# Patient Record
Sex: Male | Born: 1965 | Race: White | Hispanic: No | Marital: Married | Smoking: Former smoker
Health system: Southern US, Community
[De-identification: ages and names within clinical notes are randomized; demographics above are authoritative.]

## PROBLEM LIST (undated history)

## (undated) DIAGNOSIS — R931 Abnormal findings on diagnostic imaging of heart and coronary circulation: Secondary | ICD-10-CM

## (undated) DIAGNOSIS — E291 Testicular hypofunction: Secondary | ICD-10-CM

## (undated) HISTORY — DX: Testicular hypofunction: E29.1

## (undated) HISTORY — PX: VASECTOMY: SHX75

## (undated) HISTORY — DX: Abnormal findings on diagnostic imaging of heart and coronary circulation: R93.1

---

## 1997-11-30 ENCOUNTER — Emergency Department (HOSPITAL_COMMUNITY): Admission: EM | Admit: 1997-11-30 | Discharge: 1997-11-30 | Payer: Self-pay | Admitting: Emergency Medicine

## 2001-02-04 ENCOUNTER — Encounter: Admission: RE | Admit: 2001-02-04 | Discharge: 2001-02-04 | Payer: Self-pay | Admitting: Urology

## 2001-02-04 ENCOUNTER — Encounter: Payer: Self-pay | Admitting: Urology

## 2001-02-11 ENCOUNTER — Other Ambulatory Visit: Admission: RE | Admit: 2001-02-11 | Discharge: 2001-02-11 | Payer: Self-pay | Admitting: Urology

## 2001-11-02 ENCOUNTER — Emergency Department (HOSPITAL_COMMUNITY): Admission: EM | Admit: 2001-11-02 | Discharge: 2001-11-02 | Payer: Self-pay | Admitting: Emergency Medicine

## 2001-11-02 ENCOUNTER — Encounter: Payer: Self-pay | Admitting: Emergency Medicine

## 2014-06-17 ENCOUNTER — Encounter: Payer: Self-pay | Admitting: *Deleted

## 2015-04-23 ENCOUNTER — Emergency Department
Admission: EM | Admit: 2015-04-23 | Discharge: 2015-04-23 | Disposition: A | Discharge status: Home / Self Care | Payer: No Typology Code available for payment source | Attending: Emergency Medicine | Admitting: Emergency Medicine

## 2015-04-23 DIAGNOSIS — Z87891 Personal history of nicotine dependence: Secondary | ICD-10-CM | POA: Diagnosis not present

## 2015-04-23 DIAGNOSIS — Y998 Other external cause status: Secondary | ICD-10-CM | POA: Insufficient documentation

## 2015-04-23 DIAGNOSIS — W1839XA Other fall on same level, initial encounter: Secondary | ICD-10-CM | POA: Insufficient documentation

## 2015-04-23 DIAGNOSIS — Y9289 Other specified places as the place of occurrence of the external cause: Secondary | ICD-10-CM | POA: Diagnosis not present

## 2015-04-23 DIAGNOSIS — S4992XA Unspecified injury of left shoulder and upper arm, initial encounter: Secondary | ICD-10-CM | POA: Diagnosis present

## 2015-04-23 DIAGNOSIS — S40012A Contusion of left shoulder, initial encounter: Secondary | ICD-10-CM | POA: Insufficient documentation

## 2015-04-23 DIAGNOSIS — Y9389 Activity, other specified: Secondary | ICD-10-CM | POA: Insufficient documentation

## 2015-04-23 DIAGNOSIS — S42022A Displaced fracture of shaft of left clavicle, initial encounter for closed fracture: Secondary | ICD-10-CM | POA: Insufficient documentation

## 2015-04-23 DIAGNOSIS — S42002A Fracture of unspecified part of left clavicle, initial encounter for closed fracture: Secondary | ICD-10-CM

## 2015-04-23 MED ORDER — OXYCODONE-ACETAMINOPHEN 5-325 MG PO TABS: 1.0000 | ORAL_TABLET | ORAL | Status: DC | PRN

## 2015-04-23 MED ORDER — OXYCODONE-ACETAMINOPHEN 5-325 MG PO TABS
1.0000 | ORAL_TABLET | Freq: Once | ORAL | Status: AC
  Administered 2015-04-23: 1 via ORAL
  Filled 2015-04-23: qty 1

## 2015-04-23 NOTE — ED Provider Notes (Signed)
[facility] Emergency Department Provider Note  ____________________________________________  Time seen: Approximately 3:15 PM  I have reviewed the triage vital signs and the nursing notes.   HISTORY  Chief Complaint Clavicle Injury   HPI Frederick Hayes is a 49 y.o. male is here with his wife after being seen at [facility] urgent care. Patient states that he fell onto his left shoulder yesterday and had pain. He continued to have pain today and was seen at urgent care where he was told that his x-ray showed a clavicle fracture. He was told to come to the emergency room immediately to see an orthopedist. Patient was not given a prescription for anything for pain. He was placed in a sling prior to being discharged at urgent care. Currently he rates his pain as 3 of 10. Pain is increased with range of motion.   History reviewed. No pertinent past medical history.  There are no active problems to display for this patient.   Past Surgical History  Procedure Laterality Date  . Vasectomy      Current Outpatient Rx  Name  Route  Sig  Dispense  Refill  . oxyCODONE-acetaminophen (PERCOCET) 5-325 MG tablet   Oral   Take 1-2 tablets by mouth every 4 (four) hours as needed for severe pain.   30 tablet   0     Allergies Review of patient's allergies indicates no known allergies.  Family History  Problem Relation Age of Onset  . Hyperlipidemia Father   . Hypertension Father   . Kidney disease Father     Social History Social History  Substance Use Topics  . Smoking status: Former Smoker    Quit date: 05/29/1990  . Smokeless tobacco: Never Used  . Alcohol Use: No    Review of Systems Constitutional: No fever/chills Eyes: No visual changes. ENT: No trauma Cardiovascular: Denies chest pain. Respiratory: Denies shortness of breath. Gastrointestinal:  No nausea, no vomiting.  No diarrhea.   Musculoskeletal: Positive left shoulder pain. Skin:  Negative for rash. Neurological: Negative for headaches, focal weakness or numbness.  10-point ROS otherwise negative.  ____________________________________________   PHYSICAL EXAM:  VITAL SIGNS: ED Triage Vitals  Enc Vitals Group     BP 04/23/15 1430 128/65 mmHg     Pulse Rate 04/23/15 1430 64     Resp 04/23/15 1430 17     Temp 04/23/15 1430 98.6 F (37 C)     Temp Source 04/23/15 1430 Oral     SpO2 04/23/15 1430 99 %     Weight 04/23/15 1430 176 lb (79.833 kg)     Height 04/23/15 1430 5\' 9"  (1.753 m)     Head Cir --      Peak Flow --      Pain Score 04/23/15 1431 3     Pain Loc --      Pain Edu? --      Excl. in [place]? --     Constitutional: Alert and oriented. Well appearing and in no acute distress. Eyes: Conjunctivae are normal. PERRL. EOMI. Head: Atraumatic. Nose: No congestion/rhinnorhea. Neck: No stridor.   Cardiovascular: Normal rate, regular rhythm. Grossly normal heart sounds.  Good peripheral circulation. Respiratory: Normal respiratory effort.  No retractions. Lungs CTAB. Gastrointestinal:  No distention. Musculoskeletal: Examination of the left clavicle there is some soft tissue swelling present with minimal ecchymosis. There is moderate tenderness on palpation. There is no tenting of the skin noted. Neurologic:  Normal speech and language. No gross focal  neurologic deficits are appreciated. No gait instability. Skin:  Skin is warm, dry and intact. No rash noted. Psychiatric: Mood and affect are normal. Speech and behavior are normal.  ____________________________________________   LABS (all labs ordered are listed, but only abnormal results are displayed)  Labs Reviewed - No data to display    PROCEDURES  Procedure(s) performed: None  Critical Care performed: No  ____________________________________________   INITIAL IMPRESSION / ASSESSMENT AND PLAN / ED COURSE  Pertinent labs & imaging results that were available during my care of the  patient were reviewed by me and considered in my medical decision making (see chart for details).  Patient is given Percocet while in the emergency room for pain. We discussed positioning his sling for more comfort. Also ice and elevation as needed for swelling and for pain. He is given a prescription for Percocet as needed for pain and also instructions to call Dr. Nicholaus Bloom office on Monday for an appointment. ____________________________________________   FINAL CLINICAL IMPRESSION(S) / ED DIAGNOSES  Final diagnoses:  Fracture of left clavicle, closed, initial encounter      Johnn Hai, PA-C 04/23/15 1549  Harvest Dark, MD 04/23/15 1825

## 2015-04-23 NOTE — ED Notes (Signed)
Pt states he was sent from urgent care for left clavicle Fx, states he needed to see ortho today.the patient states he fell and injured it yesterday

## 2015-04-23 NOTE — ED Notes (Signed)
Says left clavicle fx and was sent here from [facility] for ortho.

## 2015-04-23 NOTE — ED Notes (Signed)
Adjusted sling already in place from [facility].

## 2015-04-23 NOTE — Discharge Instructions (Signed)
Clavicle Fracture A clavicle fracture is a broken collarbone. The collarbone is the long bone that connects your shoulder to your rib cage. A broken collarbone may be treated with a sling, a wrap, or surgery. Treatment depends on whether the broken ends of the bone are out of place or not. HOME CARE  Put ice on the injured area:  Put ice in a plastic bag.  Place a towel between your skin and the bag.  Leave the ice on for 20 minutes, 2-3 times a day.  If you have a wrap or splint:  Wear it all the time, and remove it only to take a bath or shower.  When you bathe or shower, keep your shoulder in the same place as when the sling or wrap is on.  Do not lift your arm.  If you have a wrap:  Another person must tighten it every day.  It should be tight enough to hold your shoulders back.  Make sure you have enough room to put your pointer finger between your body and the strap.  Loosen the wrap right away if you cannot feel your arm or your hands tingle.  Only take medicines as told by your doctor.  Avoid activities that make the injury or pain worse for 4-6 weeks after surgery.  Keep all follow-up appointments. GET HELP IF:  Your medicine is not making you feel less pain.  Your medicine is not making swelling better. GET HELP RIGHT AWAY IF:   Your cannot feel your arm.  Your arm is cold.  Your arm is a lighter color than normal. MAKE SURE YOU:   Understand these instructions.  Will watch your condition.  Will get help right away if you are not doing well or get worse.   This information is not intended to replace advice given to you by your health care provider. Make sure you discuss any questions you have with your health care provider.   Document Released: 11/01/2007 Document Revised: 05/20/2013 Document Reviewed: 04/07/2013 Elsevier Interactive Patient Education 2016 [place] and elevate as needed for pain and swelling. Take Percocet as  directed for pain. Call Dr. Nicholaus Bloom office on Monday for an appointment. Let the receptionist know that you were seen in the emergency room. Return to the emergency room if any severe worsening of your symptoms.

## 2015-04-29 ENCOUNTER — Encounter: Payer: Self-pay | Admitting: Family Medicine

## 2015-04-29 ENCOUNTER — Ambulatory Visit (INDEPENDENT_AMBULATORY_CARE_PROVIDER_SITE_OTHER): Payer: PRIVATE HEALTH INSURANCE | Admitting: Family Medicine

## 2015-04-29 VITALS — BP 136/78 | HR 61 | Temp 98.6°F | Resp 18 | Ht 70.0 in | Wt 178.0 lb

## 2015-04-29 DIAGNOSIS — Z7189 Other specified counseling: Secondary | ICD-10-CM | POA: Diagnosis not present

## 2015-04-29 DIAGNOSIS — Z7689 Persons encountering health services in other specified circumstances: Secondary | ICD-10-CM

## 2015-04-29 DIAGNOSIS — Z0000 Encounter for general adult medical examination without abnormal findings: Secondary | ICD-10-CM | POA: Diagnosis not present

## 2015-04-29 NOTE — Progress Notes (Signed)
Subjective:    Patient ID: Frederick Hayes, male    DOB: 08-07-1965, 49 y.o.   MRN: [id number]  HPI He is a very pleasant 49 year old Caucasian male here today to establish care.  His only concern is his borderline blood pressure. He is a very active gentleman. He exercises several times a week and plays competitive tennis. He has lost almost 20 pounds in the last few months. His goal weight based on his height would be 170 pounds. He does have a family history of cardiovascular disease however his father was obese and a diabetic. He is asymptomatic. He denies any chest pain shortness of breath or dyspnea on exertion. Otherwise he has no concerns No past medical history on file. Past Surgical History  Procedure Laterality Date  . Vasectomy     Current Outpatient Prescriptions on File Prior to Visit  Medication Sig Dispense Refill  . oxyCODONE-acetaminophen (PERCOCET) 5-325 MG tablet Take 1-2 tablets by mouth every 4 (four) hours as needed for severe pain. 30 tablet 0   No current facility-administered medications on file prior to visit.   No Known Allergies Social History   Social History  . Marital Status: Married    Spouse Name: N/A  . Number of Children: N/A  . Years of Education: N/A   Occupational History  . Not on file.   Social History Main Topics  . Smoking status: Former Smoker    Quit date: 05/29/1990  . Smokeless tobacco: Never Used  . Alcohol Use: No  . Drug Use: No  . Sexual Activity: Yes    Birth Control/ Protection: Surgical     Comment: Married   Other Topics Concern  . Not on file   Social History Narrative   Family History  Problem Relation Age of Onset  . Hyperlipidemia Father   . Hypertension Father   . Kidney disease Father   . Stroke Father   . Heart disease Father   . Hypertension Brother   . Heart disease Paternal Grandmother   . Heart disease Paternal Grandfather       Review of Systems  All other systems reviewed and are  negative.      Objective:   Physical Exam  Constitutional: He is oriented to person, place, and time. He appears well-developed and well-nourished. No distress.  HENT:  Head: Normocephalic and atraumatic.  Right Ear: External ear normal.  Left Ear: External ear normal.  Nose: Nose normal.  Mouth/Throat: Oropharynx is clear and moist. No oropharyngeal exudate.  Eyes: Conjunctivae and EOM are normal. Pupils are equal, round, and reactive to light. Right eye exhibits no discharge. Left eye exhibits no discharge. No scleral icterus.  Neck: Normal range of motion. Neck supple. No JVD present. No tracheal deviation present. No thyromegaly present.  Cardiovascular: Normal rate, regular rhythm, normal heart sounds and intact distal pulses.  Exam reveals no gallop and no friction rub.   No murmur heard. Pulmonary/Chest: Effort normal and breath sounds normal. No stridor. No respiratory distress. He has no wheezes. He has no rales. He exhibits no tenderness.  Abdominal: Soft. Bowel sounds are normal. He exhibits no distension and no mass. There is no tenderness. There is no rebound and no guarding.  Genitourinary: Rectum normal and prostate normal.  Musculoskeletal: Normal range of motion. He exhibits no edema or tenderness.  Lymphadenopathy:    He has no cervical adenopathy.  Neurological: He is alert and oriented to person, place, and time. He has normal reflexes. He  displays normal reflexes. No cranial nerve deficit. He exhibits normal muscle tone. Coordination normal.  Skin: Skin is warm. No rash noted. He is not diaphoretic. No erythema. No pallor.  Psychiatric: He has a normal mood and affect. His behavior is normal. Judgment and thought content normal.  Vitals reviewed.         Assessment & Plan:  Routine general medical examination at a health care facility - Plan: CBC with Differential/Platelet, COMPLETE METABOLIC PANEL WITH GFR, Lipid panel, PSA  Establishing care with new doctor,  encounter for  Physical exam today is normal. However there is a suspicious lesion adjacent to his right eye neck to the nasal bridge. It is a 7 mm flattopped fleshy papule. It appears to have raised rolled edges and some pearly iridescent. There may also be some mild central depression. It is hard to tell if it's a dermal mole versus a possible basal cell. I have recommended having him have his dermatologist take a look at that. I would like him to return fasting for a CBC, CMP, fasting lipid panel, and a PSA. I offered the patient a flu shot but he declined. He is due for colonoscopy after he turns 32.  He will call me when he wants me to schedule that. I recommended a DASH diet, <2000 mg sodium per day.

## 2015-05-03 ENCOUNTER — Other Ambulatory Visit: Payer: PRIVATE HEALTH INSURANCE

## 2015-05-03 LAB — COMPLETE METABOLIC PANEL WITH GFR
ALBUMIN: 4.5 g/dL (ref 3.6–5.1)
ALK PHOS: 49 U/L (ref 40–115)
ALT: 18 U/L (ref 9–46)
AST: 15 U/L (ref 10–40)
BUN: 17 mg/dL (ref 7–25)
CO2: 32 mmol/L — AB (ref 20–31)
Calcium: 9.8 mg/dL (ref 8.6–10.3)
Chloride: 104 mmol/L (ref 98–110)
Creat: 1.06 mg/dL (ref 0.60–1.35)
GFR, EST NON AFRICAN AMERICAN: 82 mL/min (ref >=60)
GLUCOSE: 80 mg/dL (ref 70–99)
Potassium: 4.4 mmol/L (ref 3.5–5.3)
Sodium: 143 mmol/L (ref 135–146)
TOTAL PROTEIN: 6.9 g/dL (ref 6.1–8.1)
Total Bilirubin: 0.5 mg/dL (ref 0.2–1.2)

## 2015-05-03 LAB — CBC WITH DIFFERENTIAL/PLATELET
Basophils Absolute: 0.1 10*3/uL (ref 0.0–0.1)
Basophils Relative: 1 % (ref 0–1)
Eosinophils Absolute: 0.1 10*3/uL (ref 0.0–0.7)
Eosinophils Relative: 1 % (ref 0–5)
HCT: 49.4 % (ref 39.0–52.0)
Hemoglobin: 17 g/dL (ref 13.0–17.0)
Lymphocytes Relative: 18 % (ref 12–46)
Lymphs Abs: 1.6 10*3/uL (ref 0.7–4.0)
MCH: 32.9 pg (ref 26.0–34.0)
MCHC: 34.4 g/dL (ref 30.0–36.0)
MCV: 95.7 fL (ref 78.0–100.0)
MPV: 10.5 fL (ref 8.6–12.4)
Monocytes Absolute: 0.9 10*3/uL (ref 0.1–1.0)
Monocytes Relative: 10 % (ref 3–12)
Neutro Abs: 6.2 10*3/uL (ref 1.7–7.7)
Neutrophils Relative %: 70 % (ref 43–77)
Platelets: 299 10*3/uL (ref 150–400)
RBC: 5.16 MIL/uL (ref 4.22–5.81)
RDW: 13.9 % (ref 11.5–15.5)
WBC: 8.9 10*3/uL (ref 4.0–10.5)

## 2015-05-03 LAB — LIPID PANEL
CHOL/HDL RATIO: 4.8 ratio (ref <=5.0)
CHOLESTEROL: 167 mg/dL (ref 125–200)
HDL: 35 mg/dL — AB (ref >=40)
LDL Cholesterol: 101 mg/dL (ref <130)
TRIGLYCERIDES: 157 mg/dL — AB (ref <150)
VLDL: 31 mg/dL — ABNORMAL HIGH (ref <30)

## 2015-05-04 ENCOUNTER — Encounter: Payer: Self-pay | Admitting: Family Medicine

## 2015-05-04 LAB — PSA: PSA: 1.45 ng/mL (ref <=4.00)

## 2015-07-22 ENCOUNTER — Other Ambulatory Visit: Payer: Self-pay | Admitting: Family Medicine

## 2015-07-22 MED ORDER — OSELTAMIVIR PHOSPHATE 75 MG PO CAPS: 75.0000 mg | ORAL_CAPSULE | Freq: Every day | ORAL | Status: DC

## 2015-07-22 NOTE — Progress Notes (Signed)
Wife in office with + Flu.  RX for Tamiflu to pharmacy for husband per provider instructions

## 2015-11-29 ENCOUNTER — Emergency Department (HOSPITAL_COMMUNITY): Payer: No Typology Code available for payment source

## 2015-11-29 ENCOUNTER — Emergency Department (HOSPITAL_COMMUNITY)
Admission: EM | Admit: 2015-11-29 | Discharge: 2015-11-29 | Disposition: A | Discharge status: Home / Self Care | Payer: No Typology Code available for payment source | Attending: Emergency Medicine | Admitting: Emergency Medicine

## 2015-11-29 ENCOUNTER — Encounter (HOSPITAL_COMMUNITY): Payer: Self-pay

## 2015-11-29 DIAGNOSIS — Y92410 Unspecified street and highway as the place of occurrence of the external cause: Secondary | ICD-10-CM | POA: Insufficient documentation

## 2015-11-29 DIAGNOSIS — T07XXXA Unspecified multiple injuries, initial encounter: Secondary | ICD-10-CM

## 2015-11-29 DIAGNOSIS — Y939 Activity, unspecified: Secondary | ICD-10-CM | POA: Insufficient documentation

## 2015-11-29 DIAGNOSIS — S50812A Abrasion of left forearm, initial encounter: Secondary | ICD-10-CM | POA: Diagnosis not present

## 2015-11-29 DIAGNOSIS — Z87891 Personal history of nicotine dependence: Secondary | ICD-10-CM | POA: Diagnosis not present

## 2015-11-29 DIAGNOSIS — S60511A Abrasion of right hand, initial encounter: Secondary | ICD-10-CM | POA: Insufficient documentation

## 2015-11-29 DIAGNOSIS — Y999 Unspecified external cause status: Secondary | ICD-10-CM | POA: Insufficient documentation

## 2015-11-29 DIAGNOSIS — S6991XA Unspecified injury of right wrist, hand and finger(s), initial encounter: Secondary | ICD-10-CM | POA: Diagnosis present

## 2015-11-29 MED ORDER — NAPROXEN 250 MG PO TABS
375.0000 mg | ORAL_TABLET | Freq: Once | ORAL | Status: AC
  Administered 2015-11-29: 375 mg via ORAL
  Filled 2015-11-29: qty 2

## 2015-11-29 MED ORDER — TETANUS-DIPHTH-ACELL PERTUSSIS 5-2.5-18.5 LF-MCG/0.5 IM SUSP
0.5000 mL | Freq: Once | INTRAMUSCULAR | Status: AC
  Administered 2015-11-29: 0.5 mL via INTRAMUSCULAR
  Filled 2015-11-29: qty 0.5

## 2015-11-29 MED ORDER — BACITRACIN ZINC 500 UNIT/GM EX OINT
TOPICAL_OINTMENT | Freq: Two times a day (BID) | CUTANEOUS | Status: DC
Start: 2015-11-29 — End: 2015-12-24

## 2015-11-29 MED ORDER — NAPROXEN 375 MG PO TABS: 375.0000 mg | ORAL_TABLET | Freq: Three times a day (TID) | ORAL | Status: DC

## 2015-11-29 MED ORDER — TETANUS-DIPHTHERIA TOXOIDS TD 5-2 LFU IM INJ
0.5000 mL | INJECTION | Freq: Once | INTRAMUSCULAR | Status: DC
  Filled 2015-11-29: qty 0.5

## 2015-11-29 MED ORDER — DIAZEPAM 5 MG PO TABS: 5.0000 mg | ORAL_TABLET | Freq: Three times a day (TID) | ORAL | Status: DC | PRN

## 2015-11-29 MED ORDER — BACITRACIN ZINC 500 UNIT/GM EX OINT
TOPICAL_OINTMENT | Freq: Once | CUTANEOUS | Status: AC
  Administered 2015-11-29: 3 via TOPICAL

## 2015-11-29 MED ORDER — DIAZEPAM 5 MG PO TABS
5.0000 mg | ORAL_TABLET | Freq: Once | ORAL | Status: AC
  Administered 2015-11-29: 5 mg via ORAL
  Filled 2015-11-29: qty 1
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
## 2015-11-29 NOTE — Discharge Instructions (Signed)

## 2015-12-24 ENCOUNTER — Ambulatory Visit (INDEPENDENT_AMBULATORY_CARE_PROVIDER_SITE_OTHER): Payer: PRIVATE HEALTH INSURANCE | Admitting: Family Medicine

## 2015-12-24 ENCOUNTER — Encounter: Payer: Self-pay | Admitting: Family Medicine

## 2015-12-24 VITALS — BP 108/70 | HR 64 | Temp 98.5°F | Resp 14 | Wt 191.0 lb

## 2015-12-24 DIAGNOSIS — S46812A Strain of other muscles, fascia and tendons at shoulder and upper arm level, left arm, initial encounter: Secondary | ICD-10-CM | POA: Diagnosis not present

## 2015-12-24 DIAGNOSIS — M25512 Pain in left shoulder: Secondary | ICD-10-CM
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
## 2016-01-01 ENCOUNTER — Ambulatory Visit
Admission: RE | Admit: 2016-01-01 | Discharge: 2016-01-01 | Disposition: A | Discharge status: Home / Self Care | Payer: PRIVATE HEALTH INSURANCE | Source: Ambulatory Visit | Attending: Family Medicine | Admitting: Family Medicine

## 2016-01-01 DIAGNOSIS — S46812A Strain of other muscles, fascia and tendons at shoulder and upper arm level, left arm, initial encounter: Secondary | ICD-10-CM

## 2016-01-01 DIAGNOSIS — M25512 Pain in left shoulder: Secondary | ICD-10-CM

## 2016-01-04 ENCOUNTER — Ambulatory Visit (INDEPENDENT_AMBULATORY_CARE_PROVIDER_SITE_OTHER): Payer: PRIVATE HEALTH INSURANCE | Admitting: Family Medicine

## 2016-01-04 ENCOUNTER — Encounter: Payer: Self-pay | Admitting: Family Medicine

## 2016-01-04 VITALS — BP 114/80 | HR 66 | Resp 20 | Wt 196.0 lb

## 2016-01-04 DIAGNOSIS — M25512 Pain in left shoulder: Secondary | ICD-10-CM

## 2016-01-04 DIAGNOSIS — M7582 Other shoulder lesions, left shoulder: Secondary | ICD-10-CM | POA: Diagnosis not present
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
## 2019-03-06 ENCOUNTER — Encounter: Payer: Self-pay | Admitting: Family Medicine

## 2019-03-06 ENCOUNTER — Other Ambulatory Visit: Payer: Self-pay

## 2019-03-06 ENCOUNTER — Ambulatory Visit (INDEPENDENT_AMBULATORY_CARE_PROVIDER_SITE_OTHER): Payer: PRIVATE HEALTH INSURANCE | Admitting: Family Medicine

## 2019-03-06 VITALS — BP 128/64 | HR 74 | Temp 97.4°F | Resp 14 | Ht 70.0 in | Wt 207.0 lb

## 2019-03-06 DIAGNOSIS — M722 Plantar fascial fibromatosis: Secondary | ICD-10-CM

## 2019-03-06 MED ORDER — CELECOXIB 200 MG PO CAPS: 200.0000 mg | ORAL_CAPSULE | Freq: Two times a day (BID) | ORAL | 1 refills | Status: DC

## 2019-03-06 NOTE — Progress Notes (Signed)
Subjective:    Patient ID: Frederick Hayes, male    DOB: 06-06-1965, 53 y.o.   MRN: [id number]  HPI Patient believes he has been suffering with plantar fasciitis for many months now.  He is tried massaging the ball of his foot with an ice he can on occasion.  He is taken Celebrex from a friend on occasion and saw benefit with that.  However he has done nothing consistently.  He reports pain over the plantar aspect of his left calcaneus.  The pain is worse in the middle of the calcaneus and slightly lateral.  It is worse when he first gets out of bed in the morning or after sitting for a prolonged period of time.  He reports a stabbing pain in his heel.  It hurts to walk.  He gets about 10,000 steps a day and he feels pain with the majority of the steps.  He denies any falls or injuries.  There is no erythema or swelling or deformity seen in his left heel however the pain is keeping him from being able to play tennis.  He has not tried any specific stretches.  He has not tried a night splint.  He is not taking any regular NSAID. No past medical history on file. Past Surgical History:  Procedure Laterality Date  . VASECTOMY     No current outpatient medications on file prior to visit.   No current facility-administered medications on file prior to visit.    No Known Allergies Social History   Socioeconomic History  . Marital status: Married    Spouse name: Not on file  . Number of children: Not on file  . Years of education: Not on file  . Highest education level: Not on file  Occupational History  . Not on file  Social Needs  . Financial resource strain: Not on file  . Food insecurity    Worry: Not on file    Inability: Not on file  . Transportation needs    Medical: Not on file    Non-medical: Not on file  Tobacco Use  . Smoking status: Former Smoker    Quit date: 05/29/1990    Years since quitting: 28.7  . Smokeless tobacco: Never Used  Substance and Sexual Activity  .  Alcohol use: No  . Drug use: No  . Sexual activity: Yes    Birth control/protection: Surgical    Comment: Married  Lifestyle  . Physical activity    Days per week: Not on file    Minutes per session: Not on file  . Stress: Not on file  Relationships  . Social Herbalist on phone: Not on file    Gets together: Not on file    Attends religious service: Not on file    Active member of club or organization: Not on file    Attends meetings of clubs or organizations: Not on file    Relationship status: Not on file  . Intimate partner violence    Fear of current or ex partner: Not on file    Emotionally abused: Not on file    Physically abused: Not on file    Forced sexual activity: Not on file  Other Topics Concern  . Not on file  Social History Narrative  . Not on file      Review of Systems  All other systems reviewed and are negative.      Objective:   Physical Exam  Cardiovascular:  Normal rate, regular rhythm and normal heart sounds.  Pulmonary/Chest: Effort normal and breath sounds normal.  Musculoskeletal:     Left foot: Normal range of motion. Tenderness present. No bony tenderness, swelling or crepitus.       Feet:  Vitals reviewed.         Assessment & Plan:  Plantar fasciitis of left foot  Recommended wearing a night splint for plantar fasciitis every night for the next 2 weeks.  Recommended performing stretches to improve the flexibility of the plantar fascia such as heel lunges on a step, toe stretches using a towel, etc.  Recommended taking Celebrex 200 mg p.o. twice daily for the next 2 weeks.  If the pain is improving, gradually increase activity as tolerated.  If no improvement, return for cortisone injection in the left heel

## 2019-03-21 ENCOUNTER — Ambulatory Visit (INDEPENDENT_AMBULATORY_CARE_PROVIDER_SITE_OTHER): Payer: PRIVATE HEALTH INSURANCE | Admitting: Family Medicine

## 2019-03-21 ENCOUNTER — Other Ambulatory Visit: Payer: Self-pay | Admitting: Family Medicine

## 2019-03-21 ENCOUNTER — Other Ambulatory Visit: Payer: Self-pay

## 2019-03-21 VITALS — BP 128/84 | HR 62 | Temp 98.4°F | Ht 70.0 in | Wt 215.0 lb

## 2019-03-21 DIAGNOSIS — M722 Plantar fascial fibromatosis: Secondary | ICD-10-CM

## 2019-03-21 NOTE — Progress Notes (Signed)
Subjective:    Patient ID: Frederick Hayes, male    DOB: [birth year], 53 y.o.   MRN: [id number]  HPI  03/06/19 Patient believes he has been suffering with plantar fasciitis for many months now.  He is tried massaging the ball of his foot with an ice he can on occasion.  He is taken Celebrex from a friend on occasion and saw benefit with that.  However he has done nothing consistently.  He reports pain over the plantar aspect of his left calcaneus.  The pain is worse in the middle of the calcaneus and slightly lateral.  It is worse when he first gets out of bed in the morning or after sitting for a prolonged period of time.  He reports a stabbing pain in his heel.  It hurts to walk.  He gets about 10,000 steps a day and he feels pain with the majority of the steps.  He denies any falls or injuries.  There is no erythema or swelling or deformity seen in his left heel however the pain is keeping him from being able to play tennis.  He has not tried any specific stretches.  He has not tried a night splint.  He is not taking any regular NSAID.  At that time, my plan was: Recommended wearing a night splint for plantar fasciitis every night for the next 2 weeks.  Recommended performing stretches to improve the flexibility of the plantar fascia such as heel lunges on a step, toe stretches using a towel, etc.  Recommended taking Celebrex 200 mg p.o. twice daily for the next 2 weeks.  If the pain is improving, gradually increase activity as tolerated.  If no improvement, return for cortisone injection in the left heel  03/21/19 Patient states that he saw some benefit from Celebrex however he continues to have pain in the plantar aspect of his left heel near the insertion of the plantar fascia on the calcaneus.  He still has some swelling and tenderness in that area to palpation.  He also reports pain after playing tennis.  He has been wearing the night splint.  He states that he can tolerate it for a few hours  however after that he starts to experience pain in the posterior aspect of his calf due to the stretching.  Patient would like to take the next step and try cortisone injection in the heel.  There is no erythema.  There is no bruising in the heel.  There is no palpable abnormality. No past medical history on file. Past Surgical History:  Procedure Laterality Date  . VASECTOMY     Current Outpatient Medications on File Prior to Visit  Medication Sig Dispense Refill  . celecoxib (CELEBREX) 200 MG capsule Take 1 capsule (200 mg total) by mouth 2 (two) times daily. 60 capsule 1   No current facility-administered medications on file prior to visit.    No Known Allergies Social History   Socioeconomic History  . Marital status: Married    Spouse name: Not on file  . Number of children: Not on file  . Years of education: Not on file  . Highest education level: Not on file  Occupational History  . Not on file  Social Needs  . Financial resource strain: Not on file  . Food insecurity    Worry: Not on file    Inability: Not on file  . Transportation needs    Medical: Not on file    Non-medical: Not on  file  Tobacco Use  . Smoking status: Former Smoker    Quit date: 05/29/1990    Years since quitting: 28.8  . Smokeless tobacco: Never Used  Substance and Sexual Activity  . Alcohol use: No  . Drug use: No  . Sexual activity: Yes    Birth control/protection: Surgical    Comment: Married  Lifestyle  . Physical activity    Days per week: Not on file    Minutes per session: Not on file  . Stress: Not on file  Relationships  . Social Herbalist on phone: Not on file    Gets together: Not on file    Attends religious service: Not on file    Active member of club or organization: Not on file    Attends meetings of clubs or organizations: Not on file    Relationship status: Not on file  . Intimate partner violence    Fear of current or ex partner: Not on file     Emotionally abused: Not on file    Physically abused: Not on file    Forced sexual activity: Not on file  Other Topics Concern  . Not on file  Social History Narrative  . Not on file      Review of Systems  All other systems reviewed and are negative.      Objective:   Physical Exam  Cardiovascular: Normal rate, regular rhythm and normal heart sounds.  Pulmonary/Chest: Effort normal and breath sounds normal.  Musculoskeletal:     Left foot: Normal range of motion. Tenderness present. No bony tenderness, swelling or crepitus.       Feet:  Vitals reviewed.         Assessment & Plan:  Plantar fasciitis of left foot  Using sterile technique, the patient received a cortisone injection on the plantar aspect of the left calcaneus near the insertion of the plantar fascia.  The area was prepped with Betadine and then a mixture of 0.1% lidocaine, 1 cc, and 1 cc of 40 mg/mL Kenalog was injected at the point of maximum tenderness.  The patient tolerated the procedure well without complication.  Hemostasis was achieved with a Band-Aid.  Recommended gradual increase in activity over the next 2 weeks and if still pain-free at that time, start advancing activity more liberally.  If pain does not improve, I would recommend a podiatry consultation

## 2019-04-29 ENCOUNTER — Other Ambulatory Visit: Payer: Self-pay

## 2019-04-29 MED ORDER — CELECOXIB 200 MG PO CAPS: 200.0000 mg | ORAL_CAPSULE | Freq: Two times a day (BID) | ORAL | 1 refills | Status: DC

## 2019-10-30 ENCOUNTER — Other Ambulatory Visit: Payer: Self-pay

## 2019-10-30 ENCOUNTER — Ambulatory Visit (INDEPENDENT_AMBULATORY_CARE_PROVIDER_SITE_OTHER): Payer: PRIVATE HEALTH INSURANCE | Admitting: Family Medicine

## 2019-10-30 VITALS — BP 120/82 | HR 55 | Temp 97.1°F | Wt 196.0 lb

## 2019-10-30 DIAGNOSIS — R5382 Chronic fatigue, unspecified: Secondary | ICD-10-CM | POA: Diagnosis not present

## 2019-10-30 NOTE — Progress Notes (Signed)
Subjective:    Patient ID: Frederick Hayes, male    DOB: 12-Apr-1966, 54 y.o.   MRN: [id number]  HPI  03/06/19 Patient believes he has been suffering with plantar fasciitis for many months now.  He is tried massaging the ball of his foot with an ice he can on occasion.  He is taken Celebrex from a friend on occasion and saw benefit with that.  However he has done nothing consistently.  He reports pain over the plantar aspect of his left calcaneus.  The pain is worse in the middle of the calcaneus and slightly lateral.  It is worse when he first gets out of bed in the morning or after sitting for a prolonged period of time.  He reports a stabbing pain in his heel.  It hurts to walk.  He gets about 10,000 steps a day and he feels pain with the majority of the steps.  He denies any falls or injuries.  There is no erythema or swelling or deformity seen in his left heel however the pain is keeping him from being able to play tennis.  He has not tried any specific stretches.  He has not tried a night splint.  He is not taking any regular NSAID.  At that time, my plan was: Recommended wearing a night splint for plantar fasciitis every night for the next 2 weeks.  Recommended performing stretches to improve the flexibility of the plantar fascia such as heel lunges on a step, toe stretches using a towel, etc.  Recommended taking Celebrex 200 mg p.o. twice daily for the next 2 weeks.  If the pain is improving, gradually increase activity as tolerated.  If no improvement, return for cortisone injection in the left heel  03/21/19 Patient states that he saw some benefit from Celebrex however he continues to have pain in the plantar aspect of his left heel near the insertion of the plantar fascia on the calcaneus.  He still has some swelling and tenderness in that area to palpation.  He also reports pain after playing tennis.  He has been wearing the night splint.  He states that he can tolerate it for a few hours  however after that he starts to experience pain in the posterior aspect of his calf due to the stretching.  Patient would like to take the next step and try cortisone injection in the heel.  There is no erythema.  There is no bruising in the heel.  There is no palpable abnormality.  At that time, my plan was: Using sterile technique, the patient received a cortisone injection on the plantar aspect of the left calcaneus near the insertion of the plantar fascia.  The area was prepped with Betadine and then a mixture of 0.1% lidocaine, 1 cc, and 1 cc of 40 mg/mL Kenalog was injected at the point of maximum tenderness.  The patient tolerated the procedure well without complication.  Hemostasis was achieved with a Band-Aid.  Recommended gradual increase in activity over the next 2 weeks and if still pain-free at that time, start advancing activity more liberally.  If pain does not improve, I would recommend a podiatry consultation  10/30/19 Patient presents today requesting to have his testosterone level checked.  He states that he has a history of low testosterone levels.  In the past he took testosterone replacement which helped him substantially.  He reports erectile dysfunction.  He reports fatigue.  He reports difficulty with stamina.  He reports decreasing libido.  He denies any chest pain shortness of breath or dyspnea on exertion. No past medical history on file. Past Surgical History:  Procedure Laterality Date  . VASECTOMY     Current Outpatient Medications on File Prior to Visit  Medication Sig Dispense Refill  . celecoxib (CELEBREX) 200 MG capsule Take 1 capsule (200 mg total) by mouth 2 (two) times daily. 60 capsule 1   No current facility-administered medications on file prior to visit.   No Known Allergies Social History   Socioeconomic History  . Marital status: Married    Spouse name: Not on file  . Number of children: Not on file  . Years of education: Not on file  . Highest  education level: Not on file  Occupational History  . Not on file  Tobacco Use  . Smoking status: Former Smoker    Quit date: 05/29/1990    Years since quitting: 29.4  . Smokeless tobacco: Never Used  Substance and Sexual Activity  . Alcohol use: No  . Drug use: No  . Sexual activity: Yes    Birth control/protection: Surgical    Comment: Married  Other Topics Concern  . Not on file  Social History Narrative  . Not on file   Social Determinants of Health   Financial Resource Strain:   . Difficulty of Paying Living Expenses:   Food Insecurity:   . Worried About Charity fundraiser in the Last Year:   . Arboriculturist in the Last Year:   Transportation Needs:   . Film/video editor (Medical):   Marland Kitchen Lack of Transportation (Non-Medical):   Physical Activity:   . Days of Exercise per Week:   . Minutes of Exercise per Session:   Stress:   . Feeling of Stress :   Social Connections:   . Frequency of Communication with Friends and Family:   . Frequency of Social Gatherings with Friends and Family:   . Attends Religious Services:   . Active Member of Clubs or Organizations:   . Attends Archivist Meetings:   Marland Kitchen Marital Status:   Intimate Partner Violence:   . Fear of Current or Ex-Partner:   . Emotionally Abused:   Marland Kitchen Physically Abused:   . Sexually Abused:       Review of Systems  All other systems reviewed and are negative.      Objective:   Physical Exam  Cardiovascular: Normal rate, regular rhythm and normal heart sounds. Exam reveals no gallop and no friction rub.  No murmur heard. Pulmonary/Chest: Effort normal and breath sounds normal. No respiratory distress. He has no wheezes. He has no rales.  Abdominal: Soft. Bowel sounds are normal. He exhibits no distension. There is no abdominal tenderness. There is no rebound.  Vitals reviewed.         Assessment & Plan:  Chronic fatigue - Plan: CBC with Differential/Platelet, COMPLETE METABOLIC PANEL  WITH GFR, Testosterone Total,Free,Bio, Males Check the patient CBC, CMP, and testosterone levels.  If his testosterone levels are low I would recommend checking a prolactin level along with a PSA before instituting treatment.

## 2019-10-31 ENCOUNTER — Telehealth: Payer: Self-pay | Admitting: Family Medicine

## 2019-10-31 LAB — CBC WITH DIFFERENTIAL/PLATELET
Absolute Monocytes: 813 cells/uL (ref 200–950)
Basophils Absolute: 91 cells/uL (ref 0–200)
Basophils Relative: 1.1 %
Eosinophils Absolute: 183 cells/uL (ref 15–500)
Eosinophils Relative: 2.2 %
HCT: 50.2 % — ABNORMAL HIGH (ref 38.5–50.0)
Hemoglobin: 17.1 g/dL (ref 13.2–17.1)
Lymphs Abs: 1154 cells/uL (ref 850–3900)
MCH: 32 pg (ref 27.0–33.0)
MCHC: 34.1 g/dL (ref 32.0–36.0)
MCV: 93.8 fL (ref 80.0–100.0)
MPV: 11.6 fL (ref 7.5–12.5)
Monocytes Relative: 9.8 %
Neutro Abs: 6059 cells/uL (ref 1500–7800)
Neutrophils Relative %: 73 %
Platelets: 279 10*3/uL (ref 140–400)
RBC: 5.35 10*6/uL (ref 4.20–5.80)
RDW: 12.5 % (ref 11.0–15.0)
Total Lymphocyte: 13.9 %
WBC: 8.3 10*3/uL (ref 3.8–10.8)

## 2019-10-31 LAB — COMPLETE METABOLIC PANEL WITH GFR
AG Ratio: 1.9 (calc) (ref 1.0–2.5)
ALT: 17 U/L (ref 9–46)
AST: 20 U/L (ref 10–35)
Albumin: 4.6 g/dL (ref 3.6–5.1)
Alkaline phosphatase (APISO): 48 U/L (ref 35–144)
BUN: 22 mg/dL (ref 7–25)
CO2: 23 mmol/L (ref 20–32)
Calcium: 9.7 mg/dL (ref 8.6–10.3)
Chloride: 105 mmol/L (ref 98–110)
Creat: 1.28 mg/dL (ref 0.70–1.33)
GFR, Est African American: 73 mL/min/{1.73_m2} (ref >=60)
GFR, Est Non African American: 63 mL/min/{1.73_m2} (ref >=60)
Globulin: 2.4 g/dL (calc) (ref 1.9–3.7)
Glucose, Bld: 68 mg/dL (ref 65–99)
Potassium: 4.6 mmol/L (ref 3.5–5.3)
Sodium: 141 mmol/L (ref 135–146)
Total Bilirubin: 0.9 mg/dL (ref 0.2–1.2)
Total Protein: 7 g/dL (ref 6.1–8.1)

## 2019-10-31 LAB — TESTOSTERONE TOTAL,FREE,BIO, MALES
Albumin: 4.6 g/dL (ref 3.6–5.1)
Sex Hormone Binding: 61 nmol/L — ABNORMAL HIGH (ref 10–50)
Testosterone, Bioavailable: 97.4 ng/dL — ABNORMAL LOW (ref >=110.0)
Testosterone, Free: 46.4 pg/mL (ref 46.0–224.0)
Testosterone: 585 ng/dL (ref 250–827)

## 2019-10-31 NOTE — Telephone Encounter (Signed)
#  [phone] Call for lab results

## 2019-11-03 NOTE — Telephone Encounter (Signed)
Aware of Labs verbalized understanding

## 2019-11-04 ENCOUNTER — Other Ambulatory Visit: Payer: PRIVATE HEALTH INSURANCE

## 2019-11-04 ENCOUNTER — Other Ambulatory Visit: Payer: Self-pay

## 2019-11-04 DIAGNOSIS — R7989 Other specified abnormal findings of blood chemistry: Secondary | ICD-10-CM

## 2019-11-05 LAB — PROLACTIN: Prolactin: 4.9 ng/mL (ref 2.0–18.0)

## 2019-11-05 LAB — PSA: PSA: 1.4 ng/mL (ref <=4.0)

## 2019-11-10 ENCOUNTER — Other Ambulatory Visit: Payer: Self-pay

## 2019-11-10 ENCOUNTER — Ambulatory Visit (INDEPENDENT_AMBULATORY_CARE_PROVIDER_SITE_OTHER): Payer: PRIVATE HEALTH INSURANCE | Admitting: Family Medicine

## 2019-11-10 VITALS — BP 130/74 | HR 54 | Temp 98.5°F | Ht 70.0 in | Wt 195.0 lb

## 2019-11-10 DIAGNOSIS — E291 Testicular hypofunction: Secondary | ICD-10-CM | POA: Diagnosis not present

## 2019-11-10 MED ORDER — TESTOSTERONE 20.25 MG/ACT (1.62%) TD GEL: 2.0000 | Freq: Every day | TRANSDERMAL | 5 refills | Status: DC

## 2019-11-10 NOTE — Progress Notes (Signed)
Subjective:    Patient ID: Frederick Hayes, male    DOB: 1966-05-01, 54 y.o.   MRN: [id number]  HPI   10/30/19 Patient presents today requesting to have his testosterone level checked.  He states that he has a history of low testosterone levels.  In the past he took testosterone replacement which helped him substantially.  He reports erectile dysfunction.  He reports fatigue.  He reports difficulty with stamina.  He reports decreasing libido.  He denies any chest pain shortness of breath or dyspnea on exertion.  At that time, my plan was: Check the patient CBC, CMP, and testosterone levels.  If his testosterone levels are low I would recommend checking a prolactin level along with a PSA before instituting treatment.  11/10/19 Lab on 11/04/2019  Component Date Value Ref Range Status  . PSA 11/04/2019 1.4  < OR = 4.0 ng/mL Final   Comment: The total PSA value from this assay system is  standardized against the WHO standard. The test  result will be approximately 20% lower when compared  to the equimolar-standardized total PSA (Beckman  Coulter). Comparison of serial PSA results should be  interpreted with this fact in mind. . This test was performed using the Siemens  chemiluminescent method. Values obtained from  different assay methods cannot be used interchangeably. PSA levels, regardless of value, should not be interpreted as absolute evidence of the presence or absence of disease.   . Prolactin 11/04/2019 4.9  2.0 - 18.0 ng/mL Final  Office Visit on 10/30/2019  Component Date Value Ref Range Status  . WBC 10/30/2019 8.3  3.8 - 10.8 Thousand/uL Final  . RBC 10/30/2019 5.35  4.20 - 5.80 Million/uL Final  . Hemoglobin 10/30/2019 17.1  13.2 - 17.1 g/dL Final  . HCT 10/30/2019 50.2* 38 - 50 % Final  . MCV 10/30/2019 93.8  80.0 - 100.0 fL Final  . MCH 10/30/2019 32.0  27.0 - 33.0 pg Final  . MCHC 10/30/2019 34.1  32.0 - 36.0 g/dL Final  . RDW 10/30/2019 12.5  11.0 - 15.0 % Final    . Platelets 10/30/2019 279  140 - 400 Thousand/uL Final  . MPV 10/30/2019 11.6  7.5 - 12.5 fL Final  . Neutro Abs 10/30/2019 6,059  1,500 - 7,800 cells/uL Final  . Lymphs Abs 10/30/2019 1,154  850 - 3,900 cells/uL Final  . Absolute Monocytes 10/30/2019 813  200 - 950 cells/uL Final  . Eosinophils Absolute 10/30/2019 183  15 - 500 cells/uL Final  . Basophils Absolute 10/30/2019 91  0 - 200 cells/uL Final  . Neutrophils Relative % 10/30/2019 73  % Final  . Total Lymphocyte 10/30/2019 13.9  % Final  . Monocytes Relative 10/30/2019 9.8  % Final  . Eosinophils Relative 10/30/2019 2.2  % Final  . Basophils Relative 10/30/2019 1.1  % Final  . Glucose, Bld 10/30/2019 68  65 - 99 mg/dL Final   Comment: .            Fasting reference interval .   . BUN 10/30/2019 22  7 - 25 mg/dL Final  . Creat 10/30/2019 1.28  0.70 - 1.33 mg/dL Final   Comment: For patients >31 years of age, the reference limit for Creatinine is approximately 13% higher for people identified as African-American. .   . GFR, Est Non African American 10/30/2019 63  > OR = 60 mL/min/1.17m2 Final  . GFR, Est African American 10/30/2019 73  > OR = 60 mL/min/1.73m2 Final  .  BUN/Creatinine Ratio 03/54/6568 NOT APPLICABLE  6 - 22 (calc) Final  . Sodium 10/30/2019 141  135 - 146 mmol/L Final  . Potassium 10/30/2019 4.6  3.5 - 5.3 mmol/L Final  . Chloride 10/30/2019 105  98 - 110 mmol/L Final  . CO2 10/30/2019 23  20 - 32 mmol/L Final  . Calcium 10/30/2019 9.7  8.6 - 10.3 mg/dL Final  . Total Protein 10/30/2019 7.0  6.1 - 8.1 g/dL Final  . Albumin 10/30/2019 4.6  3.6 - 5.1 g/dL Final  . Globulin 10/30/2019 2.4  1.9 - 3.7 g/dL (calc) Final  . AG Ratio 10/30/2019 1.9  1.0 - 2.5 (calc) Final  . Total Bilirubin 10/30/2019 0.9  0.2 - 1.2 mg/dL Final  . Alkaline phosphatase (APISO) 10/30/2019 48  35 - 144 U/L Final  . AST 10/30/2019 20  10 - 35 U/L Final  . ALT 10/30/2019 17  9 - 46 U/L Final  . Testosterone 10/30/2019 585  250 - 827  ng/dL Final  . Albumin 10/30/2019 4.6  3.6 - 5.1 g/dL Final  . Sex Hormone Binding 10/30/2019 61* 10 - 50 nmol/L Final  . Testosterone, Free 10/30/2019 46.4  46.0 - 224.0 pg/mL Final  . Testosterone, Bioavailable 10/30/2019 97.4* 110.0 - 575 ng/dL Final   While the patient's total testosterone was normal at 585, due to an elevated sex hormone binding protein, his free testosterone was borderline low and his bioavailable testosterone was low.  Patient is here to discuss treatment options. No past medical history on file. Past Surgical History:  Procedure Laterality Date  . VASECTOMY     Current Outpatient Medications on File Prior to Visit  Medication Sig Dispense Refill  . celecoxib (CELEBREX) 200 MG capsule Take 1 capsule (200 mg total) by mouth 2 (two) times daily. 60 capsule 1   No current facility-administered medications on file prior to visit.   No Known Allergies Social History   Socioeconomic History  . Marital status: Married    Spouse name: Not on file  . Number of children: Not on file  . Years of education: Not on file  . Highest education level: Not on file  Occupational History  . Not on file  Tobacco Use  . Smoking status: Former Smoker    Quit date: 05/29/1990    Years since quitting: 29.4  . Smokeless tobacco: Never Used  Substance and Sexual Activity  . Alcohol use: No  . Drug use: No  . Sexual activity: Yes    Birth control/protection: Surgical    Comment: Married  Other Topics Concern  . Not on file  Social History Narrative  . Not on file   Social Determinants of Health   Financial Resource Strain:   . Difficulty of Paying Living Expenses:   Food Insecurity:   . Worried About Charity fundraiser in the Last Year:   . Arboriculturist in the Last Year:   Transportation Needs:   . Film/video editor (Medical):   Marland Kitchen Lack of Transportation (Non-Medical):   Physical Activity:   . Days of Exercise per Week:   . Minutes of Exercise per Session:     Stress:   . Feeling of Stress :   Social Connections:   . Frequency of Communication with Friends and Family:   . Frequency of Social Gatherings with Friends and Family:   . Attends Religious Services:   . Active Member of Clubs or Organizations:   . Attends Archivist  Meetings:   Marland Kitchen Marital Status:   Intimate Partner Violence:   . Fear of Current or Ex-Partner:   . Emotionally Abused:   Marland Kitchen Physically Abused:   . Sexually Abused:       Review of Systems  All other systems reviewed and are negative.      Objective:   Physical Exam Vitals reviewed.  Cardiovascular:     Rate and Rhythm: Normal rate and regular rhythm.     Heart sounds: Normal heart sounds. No murmur heard.  No friction rub. No gallop.   Pulmonary:     Effort: Pulmonary effort is normal. No respiratory distress.     Breath sounds: Normal breath sounds. No wheezing or rales.  Abdominal:     General: Bowel sounds are normal. There is no distension.     Palpations: Abdomen is soft.     Tenderness: There is no abdominal tenderness. There is no rebound.           Assessment & Plan:  Hypogonadism male - Plan: Testosterone (ANDROGEL PUMP) 20.25 MG/ACT (1.62%) GEL, CBC with Differential/Platelet, COMPLETE METABOLIC PANEL WITH GFR, Lipid panel, PSA, Testosterone Total,Free,Bio, Males I explained the increased risk of cardiovascular disease, DVT/PE, prostate cancer and polycythemia due to testosterone replacement.  However due to his profound fatigue, the patient would like to try low-dose testosterone replacement.  Therefore I will start him on 1.62% AndroGel 2 pumps daily and recheck a CBC, PSA, and fasting lipid panel in 3 months.

## 2019-11-11 ENCOUNTER — Encounter: Payer: Self-pay | Admitting: Family Medicine

## 2019-11-11 ENCOUNTER — Telehealth: Payer: Self-pay | Admitting: Family Medicine

## 2019-11-11 DIAGNOSIS — E291 Testicular hypofunction: Secondary | ICD-10-CM

## 2019-11-11 NOTE — Telephone Encounter (Signed)
Cb# [phone] Refill Testosterone .[facility]amarcy inform pt need more information from Dr.Pickard . Pt wasn't sure why

## 2019-11-11 NOTE — Telephone Encounter (Signed)
Waiting for prior authorization

## 2019-11-12 ENCOUNTER — Telehealth: Payer: Self-pay | Admitting: Family Medicine

## 2019-11-12 NOTE — Telephone Encounter (Signed)
PA was denied by [facility] on cover my meds due to patient not having two low values. Called and spoke with patient and informed him of denial and reason for denial patient is coming in tomorrow morning for a testosterone lab

## 2019-11-12 NOTE — Telephone Encounter (Signed)
Prior authorization submitted on cover my meds for patient's Testosterone 1.62% Gel (60 Pumps). Pending response

## 2019-11-13 ENCOUNTER — Other Ambulatory Visit: Payer: Self-pay

## 2019-11-13 ENCOUNTER — Other Ambulatory Visit: Payer: PRIVATE HEALTH INSURANCE

## 2019-11-14 LAB — TESTOSTERONE TOTAL,FREE,BIO, MALES
Albumin: 4.5 g/dL (ref 3.6–5.1)
Sex Hormone Binding: 63 nmol/L — ABNORMAL HIGH (ref 10–50)
Testosterone, Bioavailable: 87.7 ng/dL — ABNORMAL LOW (ref >=110.0)
Testosterone, Free: 42.7 pg/mL — ABNORMAL LOW (ref 46.0–224.0)
Testosterone: 556 ng/dL (ref 250–827)

## 2019-11-14 LAB — LIPID PANEL
Cholesterol: 134 mg/dL (ref <200)
HDL: 32 mg/dL — ABNORMAL LOW (ref >=40)
LDL Cholesterol (Calc): 73 mg/dL (calc)
Non-HDL Cholesterol (Calc): 102 mg/dL (calc) (ref <130)
Total CHOL/HDL Ratio: 4.2 (calc) (ref <5.0)
Triglycerides: 195 mg/dL — ABNORMAL HIGH (ref <150)

## 2019-11-14 LAB — COMPLETE METABOLIC PANEL WITH GFR
AG Ratio: 2 (calc) (ref 1.0–2.5)
ALT: 14 U/L (ref 9–46)
AST: 15 U/L (ref 10–35)
Albumin: 4.5 g/dL (ref 3.6–5.1)
Alkaline phosphatase (APISO): 50 U/L (ref 35–144)
BUN: 17 mg/dL (ref 7–25)
CO2: 27 mmol/L (ref 20–32)
Calcium: 9.5 mg/dL (ref 8.6–10.3)
Chloride: 105 mmol/L (ref 98–110)
Creat: 1.21 mg/dL (ref 0.70–1.33)
GFR, Est African American: 78 mL/min/{1.73_m2} (ref >=60)
GFR, Est Non African American: 67 mL/min/{1.73_m2} (ref >=60)
Globulin: 2.3 g/dL (calc) (ref 1.9–3.7)
Glucose, Bld: 106 mg/dL — ABNORMAL HIGH (ref 65–99)
Potassium: 4.4 mmol/L (ref 3.5–5.3)
Sodium: 140 mmol/L (ref 135–146)
Total Bilirubin: 0.7 mg/dL (ref 0.2–1.2)
Total Protein: 6.8 g/dL (ref 6.1–8.1)

## 2019-11-14 LAB — CBC WITH DIFFERENTIAL/PLATELET
Absolute Monocytes: 583 cells/uL (ref 200–950)
Basophils Absolute: 72 cells/uL (ref 0–200)
Basophils Relative: 1 %
Eosinophils Absolute: 94 cells/uL (ref 15–500)
Eosinophils Relative: 1.3 %
HCT: 47.6 % (ref 38.5–50.0)
Hemoglobin: 16.2 g/dL (ref 13.2–17.1)
Lymphs Abs: 1526 cells/uL (ref 850–3900)
MCH: 32 pg (ref 27.0–33.0)
MCHC: 34 g/dL (ref 32.0–36.0)
MCV: 94.1 fL (ref 80.0–100.0)
MPV: 11.6 fL (ref 7.5–12.5)
Monocytes Relative: 8.1 %
Neutro Abs: 4925 cells/uL (ref 1500–7800)
Neutrophils Relative %: 68.4 %
Platelets: 241 10*3/uL (ref 140–400)
RBC: 5.06 10*6/uL (ref 4.20–5.80)
RDW: 13.1 % (ref 11.0–15.0)
Total Lymphocyte: 21.2 %
WBC: 7.2 10*3/uL (ref 3.8–10.8)

## 2019-11-14 LAB — PSA: PSA: 1.5 ng/mL (ref <=4.0)

## 2019-11-20 ENCOUNTER — Other Ambulatory Visit: Payer: Self-pay | Admitting: Family Medicine

## 2019-11-20 MED ORDER — TESTOSTERONE CYPIONATE 200 MG/ML IM SOLN: 200.0000 mg | INTRAMUSCULAR | 1 refills | Status: DC

## 2019-11-20 NOTE — Telephone Encounter (Signed)
Appeal Faxed.  Date Range 11/13/2019 10/30/2019  Lab Collection Time  9:11AM 10:33AM  Testosterone, Free 46- 224 pg/mL 42.7 (L) 46.4 (L)  Testosterone, Bioavailable 110- 575 ng/dL 87.7 (L) 97.4 (L)

## 2020-02-10 ENCOUNTER — Other Ambulatory Visit: Payer: Self-pay

## 2020-02-10 ENCOUNTER — Encounter: Payer: Self-pay | Admitting: Family Medicine

## 2020-02-10 ENCOUNTER — Ambulatory Visit (INDEPENDENT_AMBULATORY_CARE_PROVIDER_SITE_OTHER): Payer: PRIVATE HEALTH INSURANCE | Admitting: Family Medicine

## 2020-02-10 VITALS — BP 118/66 | HR 56 | Resp 16 | Ht 70.0 in | Wt 190.0 lb

## 2020-02-10 DIAGNOSIS — E291 Testicular hypofunction: Secondary | ICD-10-CM | POA: Diagnosis not present

## 2020-02-10 MED ORDER — TESTOSTERONE 20.25 MG/ACT (1.62%) TD GEL: 2.0000 | Freq: Every day | TRANSDERMAL | 5 refills | Status: DC

## 2020-02-10 NOTE — Progress Notes (Signed)
Subjective:    Patient ID: Frederick Hayes, male    DOB: January 30, 1966, 54 y.o.   MRN: [id number]  HPI   10/30/19 Patient presents today requesting to have his testosterone level checked.  He states that he has a history of low testosterone levels.  In the past he took testosterone replacement which helped him substantially.  He reports erectile dysfunction.  He reports fatigue.  He reports difficulty with stamina.  He reports decreasing libido.  He denies any chest pain shortness of breath or dyspnea on exertion.  At that time, my plan was: Check the patient CBC, CMP, and testosterone levels.  If his testosterone levels are low I would recommend checking a prolactin level along with a PSA before instituting treatment.  11/10/19 No visits with results within 1 Month(s) from this visit.  Latest known visit with results is:  Office Visit on 11/10/2019  Component Date Value Ref Range Status  . WBC 11/13/2019 7.2  3.8 - 10.8 Thousand/uL Final  . RBC 11/13/2019 5.06  4.20 - 5.80 Million/uL Final  . Hemoglobin 11/13/2019 16.2  13.2 - 17.1 g/dL Final  . HCT 11/13/2019 47.6  38 - 50 % Final  . MCV 11/13/2019 94.1  80.0 - 100.0 fL Final  . MCH 11/13/2019 32.0  27.0 - 33.0 pg Final  . MCHC 11/13/2019 34.0  32.0 - 36.0 g/dL Final  . RDW 11/13/2019 13.1  11.0 - 15.0 % Final  . Platelets 11/13/2019 241  140 - 400 Thousand/uL Final  . MPV 11/13/2019 11.6  7.5 - 12.5 fL Final  . Neutro Abs 11/13/2019 4,925  1,500 - 7,800 cells/uL Final  . Lymphs Abs 11/13/2019 1,526  850 - 3,900 cells/uL Final  . Absolute Monocytes 11/13/2019 583  200 - 950 cells/uL Final  . Eosinophils Absolute 11/13/2019 94  15 - 500 cells/uL Final  . Basophils Absolute 11/13/2019 72  0 - 200 cells/uL Final  . Neutrophils Relative % 11/13/2019 68.4  % Final  . Total Lymphocyte 11/13/2019 21.2  % Final  . Monocytes Relative 11/13/2019 8.1  % Final  . Eosinophils Relative 11/13/2019 1.3  % Final  . Basophils Relative 11/13/2019 1.0   % Final  . Glucose, Bld 11/13/2019 106* 65 - 99 mg/dL Final   Comment: .            Fasting reference interval . For someone without known diabetes, a glucose value between 100 and 125 mg/dL is consistent with prediabetes and should be confirmed with a follow-up test. .   . BUN 11/13/2019 17  7 - 25 mg/dL Final  . Creat 11/13/2019 1.21  0.70 - 1.33 mg/dL Final   Comment: For patients >75 years of age, the reference limit for Creatinine is approximately 13% higher for people identified as African-American. .   . GFR, Est Non African American 11/13/2019 67  > OR = 60 mL/min/1.10m2 Final  . GFR, Est African American 11/13/2019 78  > OR = 60 mL/min/1.89m2 Final  . BUN/Creatinine Ratio 43/15/4008 NOT APPLICABLE  6 - 22 (calc) Final  . Sodium 11/13/2019 140  135 - 146 mmol/L Final  . Potassium 11/13/2019 4.4  3.5 - 5.3 mmol/L Final  . Chloride 11/13/2019 105  98 - 110 mmol/L Final  . CO2 11/13/2019 27  20 - 32 mmol/L Final  . Calcium 11/13/2019 9.5  8.6 - 10.3 mg/dL Final  . Total Protein 11/13/2019 6.8  6.1 - 8.1 g/dL Final  . Albumin 11/13/2019 4.5  3.6 -  5.1 g/dL Final  . Globulin 11/13/2019 2.3  1.9 - 3.7 g/dL (calc) Final  . AG Ratio 11/13/2019 2.0  1.0 - 2.5 (calc) Final  . Total Bilirubin 11/13/2019 0.7  0.2 - 1.2 mg/dL Final  . Alkaline phosphatase (APISO) 11/13/2019 50  35 - 144 U/L Final  . AST 11/13/2019 15  10 - 35 U/L Final  . ALT 11/13/2019 14  9 - 46 U/L Final  . Cholesterol 11/13/2019 134  <200 mg/dL Final  . HDL 11/13/2019 32* > OR = 40 mg/dL Final  . Triglycerides 11/13/2019 195* <150 mg/dL Final  . LDL Cholesterol (Calc) 11/13/2019 73  mg/dL (calc) Final   Comment: Reference range: <100 . Desirable range <100 mg/dL for primary prevention;   <70 mg/dL for patients with CHD or diabetic patients  with > or = 2 CHD risk factors. Marland Kitchen LDL-C is now calculated using the Martin-Hopkins  calculation, which is a validated novel method providing  better accuracy than the  Friedewald equation in the  estimation of LDL-C.  Cresenciano Genre et al. Annamaria Helling. 0932;355(73): 2061-2068  (http://education.[facility].com/faq/FAQ164)   . Total CHOL/HDL Ratio 11/13/2019 4.2  <5.0 (calc) Final  . Non-HDL Cholesterol (Calc) 11/13/2019 102  <130 mg/dL (calc) Final   Comment: For patients with diabetes plus 1 major ASCVD risk  factor, treating to a non-HDL-C goal of <100 mg/dL  (LDL-C of <70 mg/dL) is considered a therapeutic  option.   Marland Kitchen PSA 11/13/2019 1.5  < OR = 4.0 ng/mL Final   Comment: The total PSA value from this assay system is  standardized against the WHO standard. The test  result will be approximately 20% lower when compared  to the equimolar-standardized total PSA (Beckman  Coulter). Comparison of serial PSA results should be  interpreted with this fact in mind. . This test was performed using the Siemens  chemiluminescent method. Values obtained from  different assay methods cannot be used interchangeably. PSA levels, regardless of value, should not be interpreted as absolute evidence of the presence or absence of disease.   . Testosterone 11/13/2019 556  250 - 827 ng/dL Final  . Albumin 11/13/2019 4.5  3.6 - 5.1 g/dL Final  . Sex Hormone Binding 11/13/2019 63* 10 - 50 nmol/L Final  . Testosterone, Free 11/13/2019 42.7* 46.0 - 224.0 pg/mL Final  . Testosterone, Bioavailable 11/13/2019 87.7* 110.0 - 575 ng/dL Final   While the patient's total testosterone was normal at 585, due to an elevated sex hormone binding protein, his free testosterone was borderline low and his bioavailable testosterone was low.  Patient is here to discuss treatment options.  At that time, my plan was: I explained the increased risk of cardiovascular disease, DVT/PE, prostate cancer and polycythemia due to testosterone replacement.  However due to his profound fatigue, the patient would like to try low-dose testosterone replacement.  Therefore I will start him on 1.62% AndroGel 2  pumps daily and recheck a CBC, PSA, and fasting lipid panel in 3 months.  02/10/20 Patient has been taking the testosterone as prescribed.  He would like to switch to topical therapy because the shots are extremely painful.  He states that his energy level has improved since he has been on the testosterone.  He denies any chest pain or leg swelling or shortness of breath.  His cholesterol when last checked was found to be elevated however this was not a fasting sample and therefore the cholesterol was erroneous.  No past medical history on file. Past Surgical History:  Procedure Laterality Date  . VASECTOMY     Current Outpatient Medications on File Prior to Visit  Medication Sig Dispense Refill  . celecoxib (CELEBREX) 200 MG capsule Take 1 capsule (200 mg total) by mouth 2 (two) times daily. 60 capsule 1  . Testosterone (ANDROGEL PUMP) 20.25 MG/ACT (1.62%) GEL Place 2 Pump onto the skin daily. 75 g 5  . testosterone cypionate (DEPOTESTOSTERONE CYPIONATE) 200 MG/ML injection Inject 1 mL (200 mg total) into the muscle every 14 (fourteen) days. 6 mL 1   No current facility-administered medications on file prior to visit.   No Known Allergies Social History   Socioeconomic History  . Marital status: Married    Spouse name: Not on file  . Number of children: Not on file  . Years of education: Not on file  . Highest education level: Not on file  Occupational History  . Not on file  Tobacco Use  . Smoking status: Former Smoker    Quit date: 05/29/1990    Years since quitting: 29.7  . Smokeless tobacco: Never Used  Substance and Sexual Activity  . Alcohol use: No  . Drug use: No  . Sexual activity: Yes    Birth control/protection: Surgical    Comment: Married  Other Topics Concern  . Not on file  Social History Narrative  . Not on file   Social Determinants of Health   Financial Resource Strain:   . Difficulty of Paying Living Expenses: Not on file  Food Insecurity:   .  Worried About Charity fundraiser in the Last Year: Not on file  . Ran Out of Food in the Last Year: Not on file  Transportation Needs:   . Lack of Transportation (Medical): Not on file  . Lack of Transportation (Non-Medical): Not on file  Physical Activity:   . Days of Exercise per Week: Not on file  . Minutes of Exercise per Session: Not on file  Stress:   . Feeling of Stress : Not on file  Social Connections:   . Frequency of Communication with Friends and Family: Not on file  . Frequency of Social Gatherings with Friends and Family: Not on file  . Attends Religious Services: Not on file  . Active Member of Clubs or Organizations: Not on file  . Attends Archivist Meetings: Not on file  . Marital Status: Not on file  Intimate Partner Violence:   . Fear of Current or Ex-Partner: Not on file  . Emotionally Abused: Not on file  . Physically Abused: Not on file  . Sexually Abused: Not on file      Review of Systems  All other systems reviewed and are negative.      Objective:   Physical Exam Vitals reviewed.  Cardiovascular:     Rate and Rhythm: Normal rate and regular rhythm.     Heart sounds: Normal heart sounds. No murmur heard.  No friction rub. No gallop.   Pulmonary:     Effort: Pulmonary effort is normal. No respiratory distress.     Breath sounds: Normal breath sounds. No wheezing or rales.  Abdominal:     General: Bowel sounds are normal. There is no distension.     Palpations: Abdomen is soft.     Tenderness: There is no abdominal tenderness. There is no rebound.           Assessment & Plan:   Hypogonadism male - Plan: Testosterone Total,Free,Bio, Males, CBC with Differential/Platelet, COMPLETE METABOLIC PANEL  WITH GFR, PSA  Check a testosterone level, CBC, CMP, and a PSA.  As long as lab work is okay, we will simply switch to AndroGel 1.62%, 2 pumps daily

## 2020-02-11 LAB — CBC WITH DIFFERENTIAL/PLATELET
Absolute Monocytes: 780 cells/uL (ref 200–950)
Basophils Absolute: 103 cells/uL (ref 0–200)
Basophils Relative: 1.1 %
Eosinophils Absolute: 141 cells/uL (ref 15–500)
Eosinophils Relative: 1.5 %
HCT: 52.5 % — ABNORMAL HIGH (ref 38.5–50.0)
Hemoglobin: 17.6 g/dL — ABNORMAL HIGH (ref 13.2–17.1)
Lymphs Abs: 1974 cells/uL (ref 850–3900)
MCH: 32.2 pg (ref 27.0–33.0)
MCHC: 33.5 g/dL (ref 32.0–36.0)
MCV: 96.2 fL (ref 80.0–100.0)
MPV: 11.7 fL (ref 7.5–12.5)
Monocytes Relative: 8.3 %
Neutro Abs: 6401 cells/uL (ref 1500–7800)
Neutrophils Relative %: 68.1 %
Platelets: 261 10*3/uL (ref 140–400)
RBC: 5.46 10*6/uL (ref 4.20–5.80)
RDW: 12.5 % (ref 11.0–15.0)
Total Lymphocyte: 21 %
WBC: 9.4 10*3/uL (ref 3.8–10.8)

## 2020-02-11 LAB — COMPLETE METABOLIC PANEL WITH GFR
AG Ratio: 1.8 (calc) (ref 1.0–2.5)
ALT: 10 U/L (ref 9–46)
AST: 15 U/L (ref 10–35)
Albumin: 4.4 g/dL (ref 3.6–5.1)
Alkaline phosphatase (APISO): 48 U/L (ref 35–144)
BUN: 15 mg/dL (ref 7–25)
CO2: 27 mmol/L (ref 20–32)
Calcium: 9.6 mg/dL (ref 8.6–10.3)
Chloride: 106 mmol/L (ref 98–110)
Creat: 1.21 mg/dL (ref 0.70–1.33)
GFR, Est African American: 78 mL/min/{1.73_m2} (ref >=60)
GFR, Est Non African American: 67 mL/min/{1.73_m2} (ref >=60)
Globulin: 2.4 g/dL (calc) (ref 1.9–3.7)
Glucose, Bld: 81 mg/dL (ref 65–99)
Potassium: 4.5 mmol/L (ref 3.5–5.3)
Sodium: 141 mmol/L (ref 135–146)
Total Bilirubin: 0.8 mg/dL (ref 0.2–1.2)
Total Protein: 6.8 g/dL (ref 6.1–8.1)

## 2020-02-11 LAB — TESTOSTERONE TOTAL,FREE,BIO, MALES
Albumin: 4.4 g/dL (ref 3.6–5.1)
Sex Hormone Binding: 41 nmol/L (ref 10–50)
Testosterone, Bioavailable: 117.1 ng/dL (ref >=110.0)
Testosterone, Free: 58.2 pg/mL (ref 46.0–224.0)
Testosterone: 514 ng/dL (ref 250–827)

## 2020-02-11 LAB — PSA: PSA: 1.42 ng/mL (ref <=4.0)

## 2020-02-12 ENCOUNTER — Telehealth: Payer: Self-pay

## 2020-02-12 NOTE — Telephone Encounter (Signed)
Pt notified Verbalizes understanding 

## 2020-02-12 NOTE — Telephone Encounter (Signed)
Pt called and wants to switch from the testosterone injections to the cream. Please advise.

## 2020-02-12 NOTE — Telephone Encounter (Signed)
I ordered the cream in epic, it should be at his pharmacy.  Please check an see if they have it.

## 2020-02-16 ENCOUNTER — Telehealth: Payer: Self-pay

## 2020-02-20 NOTE — Addendum Note (Signed)
Addended by: Sheral Flow on: 02/20/2020 04:22 PM   Modules accepted: Orders

## 2020-02-20 NOTE — Telephone Encounter (Signed)
Received request from pharmacy for PA on Androgel.   PA submitted.   Dx: E29.1- low testosterone.   Your demographic data has been sent to [facility] successfully!  [facility] typically takes 5-10 minutes to respond, but it may take a little longer in some cases. You will be notified by email when available. You can also check for an update later by opening this request from your dashboard. Please do not fax or call [employer] to resubmit this request. If you need assistance, please chat with [employer]eds or call us at [phone].  If it has been longer than 24 hours, please reach out to [place].

## 2020-03-01 NOTE — Telephone Encounter (Signed)
error 

## 2020-08-10 ENCOUNTER — Other Ambulatory Visit: Payer: Self-pay

## 2020-08-10 ENCOUNTER — Other Ambulatory Visit: Payer: Self-pay | Admitting: Family Medicine

## 2020-08-10 ENCOUNTER — Ambulatory Visit (INDEPENDENT_AMBULATORY_CARE_PROVIDER_SITE_OTHER): Payer: PRIVATE HEALTH INSURANCE | Admitting: Family Medicine

## 2020-08-10 ENCOUNTER — Encounter: Payer: Self-pay | Admitting: Family Medicine

## 2020-08-10 ENCOUNTER — Telehealth: Payer: Self-pay

## 2020-08-10 VITALS — BP 128/76 | HR 70 | Temp 98.9°F | Resp 14 | Ht 70.0 in | Wt 181.0 lb

## 2020-08-10 DIAGNOSIS — Z13220 Encounter for screening for lipoid disorders: Secondary | ICD-10-CM | POA: Diagnosis not present

## 2020-08-10 DIAGNOSIS — Z136 Encounter for screening for cardiovascular disorders: Secondary | ICD-10-CM

## 2020-08-10 DIAGNOSIS — E291 Testicular hypofunction: Secondary | ICD-10-CM | POA: Insufficient documentation

## 2020-08-10 LAB — CBC WITH DIFFERENTIAL/PLATELET
Basophils Relative: 0.6 %
Eosinophils Absolute: 109 cells/uL (ref 15–500)
Neutro Abs: 8349 cells/uL — ABNORMAL HIGH (ref 1500–7800)
Neutrophils Relative %: 76.6 %
RBC: 5.63 10*6/uL (ref 4.20–5.80)
WBC: 10.9 10*3/uL — ABNORMAL HIGH (ref 3.8–10.8)

## 2020-08-10 LAB — COMPLETE METABOLIC PANEL WITH GFR
Albumin: 4.8 g/dL (ref 3.6–5.1)
Alkaline phosphatase (APISO): 53 U/L (ref 35–144)
GFR, Est Non African American: 62 mL/min/{1.73_m2} (ref >=60)

## 2020-08-10 LAB — LIPID PANEL
Cholesterol: 169 mg/dL (ref <200)
HDL: 40 mg/dL (ref >=40)
LDL Cholesterol (Calc): 102 mg/dL (calc) — ABNORMAL HIGH
Non-HDL Cholesterol (Calc): 129 mg/dL (calc) (ref <130)
Total CHOL/HDL Ratio: 4.2 (calc) (ref <5.0)
Triglycerides: 167 mg/dL — ABNORMAL HIGH (ref <150)

## 2020-08-10 MED ORDER — TADALAFIL 20 MG PO TABS
10.0000 mg | ORAL_TABLET | ORAL | 11 refills | Status: DC | PRN
Start: 2020-08-10 — End: 2020-10-18

## 2020-08-10 MED ORDER — SILDENAFIL CITRATE 100 MG PO TABS
50.0000 mg | ORAL_TABLET | Freq: Every day | ORAL | 11 refills | Status: DC | PRN
Start: 2020-08-10 — End: 2020-08-11

## 2020-08-10 NOTE — Telephone Encounter (Signed)
I sent in tadalafil

## 2020-08-10 NOTE — Telephone Encounter (Signed)
Call placed to patient. [place].   Of note, sildenafil is <$30 at [facility] with [employer] [facility]oup[facility].

## 2020-08-10 NOTE — Addendum Note (Signed)
Addended by: Sheral Flow on: 08/10/2020 08:39 AM   Modules accepted: Orders

## 2020-08-10 NOTE — Progress Notes (Signed)
Subjective:    Patient ID: Frederick Hayes, male    DOB: 1965-07-04, 55 y.o.   MRN: [id number]  HPI   10/30/19 Patient presents today requesting to have his testosterone level checked.  He states that he has a history of low testosterone levels.  In the past he took testosterone replacement which helped him substantially.  He reports erectile dysfunction.  He reports fatigue.  He reports difficulty with stamina.  He reports decreasing libido.  He denies any chest pain shortness of breath or dyspnea on exertion.  At that time, my plan was: Check the patient CBC, CMP, and testosterone levels.  If his testosterone levels are low I would recommend checking a prolactin level along with a PSA before instituting treatment.  11/10/19 While the patient's total testosterone was normal at 585, due to an elevated sex hormone binding protein, his free testosterone was borderline low and his bioavailable testosterone was low.  Patient is here to discuss treatment options.  At that time, my plan was: I explained the increased risk of cardiovascular disease, DVT/PE, prostate cancer and polycythemia due to testosterone replacement.  However due to his profound fatigue, the patient would like to try low-dose testosterone replacement.  Therefore I will start him on 1.62% AndroGel 2 pumps daily and recheck a CBC, PSA, and fasting lipid panel in 3 months.  02/10/20 Patient has been taking the testosterone as prescribed.  He would like to switch to topical therapy because the shots are extremely painful.  He states that his energy level has improved since he has been on the testosterone.  He denies any chest pain or leg swelling or shortness of breath.  His cholesterol when last checked was found to be elevated however this was not a fasting sample and therefore the cholesterol was erroneous.  AT that time, my plan was: Check a testosterone level, CBC, CMP, and a PSA.  As long as lab work is okay, we will simply switch to  AndroGel 1.62%, 2 pumps daily  08/10/20 Patient is here for follow up.  Thankfully, the patient states that the medicine is helping.  He states that he has more energy.  He is able to play tennis with less fatigue.  He is able to exercise more.  He definitely states that he feels that he is benefiting from the medication.  He states he is taking it about 2 out of every 3 days.  This seems to be sufficient to him.  He denies any chest pain shortness of breath or dyspnea on exertion.  However he does report some erectile dysfunction.  He is interested in medication that may assist in this.  He denies any symptoms of a DVT.  He denies any headaches or strokelike symptoms.  There is no evidence of polycythemia based on his skin pigmentation  No past medical history on file. Past Surgical History:  Procedure Laterality Date  . VASECTOMY     Current Outpatient Medications on File Prior to Visit  Medication Sig Dispense Refill  . celecoxib (CELEBREX) 200 MG capsule Take 1 capsule (200 mg total) by mouth 2 (two) times daily. 60 capsule 1  . Testosterone (ANDROGEL PUMP) 20.25 MG/ACT (1.62%) GEL Place 2 Pump onto the skin daily. 75 g 5   No current facility-administered medications on file prior to visit.   No Known Allergies Social History   Socioeconomic History  . Marital status: Married    Spouse name: Not on file  . Number of children:  Not on file  . Years of education: Not on file  . Highest education level: Not on file  Occupational History  . Not on file  Tobacco Use  . Smoking status: Former Smoker    Quit date: 05/29/1990    Years since quitting: 30.2  . Smokeless tobacco: Never Used  Substance and Sexual Activity  . Alcohol use: No  . Drug use: No  . Sexual activity: Yes    Birth control/protection: Surgical    Comment: Married  Other Topics Concern  . Not on file  Social History Narrative  . Not on file   Social Determinants of Health   Financial Resource Strain: Not on  file  Food Insecurity: Not on file  Transportation Needs: Not on file  Physical Activity: Not on file  Stress: Not on file  Social Connections: Not on file  Intimate Partner Violence: Not on file      Review of Systems  All other systems reviewed and are negative.      Objective:   Physical Exam Vitals reviewed.  Cardiovascular:     Rate and Rhythm: Normal rate and regular rhythm.     Heart sounds: Normal heart sounds. No murmur heard. No friction rub. No gallop.   Pulmonary:     Effort: Pulmonary effort is normal. No respiratory distress.     Breath sounds: Normal breath sounds. No wheezing or rales.  Abdominal:     General: Bowel sounds are normal. There is no distension.     Palpations: Abdomen is soft.     Tenderness: There is no abdominal tenderness. There is no rebound.           Assessment & Plan:   Hypogonadism in male - Plan: CBC with Differential/Platelet, COMPLETE METABOLIC PANEL WITH GFR, PSA, Testosterone Total,Free,Bio, Males  Symptomatically he is doing much better.  I will check a CBC to rule out polycythemia.  I will check a PSA to rule out any increase in PSA to hormone replacement.  Also check a testosterone level.  Gave the patient Viagra 100 mg tablets.  He can take 1/2 to 1 tablet/day as needed for desired effect.

## 2020-08-11 LAB — COMPLETE METABOLIC PANEL WITH GFR
AG Ratio: 2 (calc) (ref 1.0–2.5)
ALT: 14 U/L (ref 9–46)
AST: 16 U/L (ref 10–35)
BUN: 18 mg/dL (ref 7–25)
CO2: 25 mmol/L (ref 20–32)
Calcium: 9.9 mg/dL (ref 8.6–10.3)
Chloride: 105 mmol/L (ref 98–110)
Creat: 1.29 mg/dL (ref 0.70–1.33)
GFR, Est African American: 72 mL/min/{1.73_m2} (ref >=60)
Globulin: 2.4 g/dL (calc) (ref 1.9–3.7)
Glucose, Bld: 90 mg/dL (ref 65–99)
Potassium: 4.5 mmol/L (ref 3.5–5.3)
Sodium: 140 mmol/L (ref 135–146)
Total Bilirubin: 0.6 mg/dL (ref 0.2–1.2)
Total Protein: 7.2 g/dL (ref 6.1–8.1)

## 2020-08-11 LAB — CBC WITH DIFFERENTIAL/PLATELET
Absolute Monocytes: 850 cells/uL (ref 200–950)
Basophils Absolute: 65 cells/uL (ref 0–200)
Eosinophils Relative: 1 %
HCT: 54.3 % — ABNORMAL HIGH (ref 38.5–50.0)
Hemoglobin: 18.6 g/dL — ABNORMAL HIGH (ref 13.2–17.1)
Lymphs Abs: 1526 cells/uL (ref 850–3900)
MCH: 33 pg (ref 27.0–33.0)
MCHC: 34.3 g/dL (ref 32.0–36.0)
MCV: 96.4 fL (ref 80.0–100.0)
MPV: 11.1 fL (ref 7.5–12.5)
Monocytes Relative: 7.8 %
Platelets: 247 10*3/uL (ref 140–400)
RDW: 12.5 % (ref 11.0–15.0)
Total Lymphocyte: 14 %

## 2020-08-11 LAB — TESTOSTERONE TOTAL,FREE,BIO, MALES
Albumin: 4.8 g/dL (ref 3.6–5.1)
Sex Hormone Binding: 46 nmol/L (ref 10–50)
Testosterone, Bioavailable: 370.7 ng/dL (ref >=110.0)
Testosterone, Free: 169.5 pg/mL (ref 46.0–224.0)
Testosterone: 1353 ng/dL — ABNORMAL HIGH (ref 250–827)

## 2020-08-11 LAB — PSA: PSA: 1.78 ng/mL (ref <=4.0)

## 2020-08-11 MED ORDER — SILDENAFIL CITRATE 100 MG PO TABS: 50.0000 mg | ORAL_TABLET | Freq: Every day | ORAL | 3 refills | Status: DC | PRN

## 2020-08-11 NOTE — Telephone Encounter (Signed)
Call placed to patient and patient made aware.   Patient agreeable to paying out of pocket at [facility].   Prescription sent to pharmacy. ]

## 2020-10-17 ENCOUNTER — Other Ambulatory Visit: Payer: Self-pay | Admitting: Family Medicine

## 2020-10-17 DIAGNOSIS — E291 Testicular hypofunction: Secondary | ICD-10-CM

## 2020-10-18 ENCOUNTER — Other Ambulatory Visit: Payer: Self-pay | Admitting: Family Medicine

## 2020-10-18 MED ORDER — SILDENAFIL CITRATE 100 MG PO TABS: 50.0000 mg | ORAL_TABLET | Freq: Every day | ORAL | 3 refills | Status: DC | PRN

## 2020-10-18 NOTE — Telephone Encounter (Signed)
Ok to refill 

## 2020-10-18 NOTE — Addendum Note (Signed)
Addended by: Sheral Flow on: 10/18/2020 04:58 PM   Modules accepted: Orders

## 2021-01-10 ENCOUNTER — Encounter (HOSPITAL_COMMUNITY): Payer: Self-pay

## 2021-01-10 ENCOUNTER — Ambulatory Visit (HOSPITAL_COMMUNITY)
Admission: EM | Admit: 2021-01-10 | Discharge: 2021-01-10 | Disposition: A | Discharge status: Home / Self Care | Payer: No Typology Code available for payment source | Attending: Family Medicine | Admitting: Family Medicine

## 2021-01-10 ENCOUNTER — Other Ambulatory Visit: Payer: Self-pay

## 2021-01-10 ENCOUNTER — Ambulatory Visit (INDEPENDENT_AMBULATORY_CARE_PROVIDER_SITE_OTHER): Payer: No Typology Code available for payment source

## 2021-01-10 DIAGNOSIS — S52572A Other intraarticular fracture of lower end of left radius, initial encounter for closed fracture: Secondary | ICD-10-CM | POA: Diagnosis not present

## 2021-01-10 DIAGNOSIS — M25532 Pain in left wrist: Secondary | ICD-10-CM

## 2021-01-10 DIAGNOSIS — W19XXXA Unspecified fall, initial encounter: Secondary | ICD-10-CM | POA: Diagnosis not present

## 2021-01-10 NOTE — ED Provider Notes (Signed)
[facility]   [id number] 01/10/21 Arrival Time: [id number]  ASSESSMENT & PLAN:  1. Left wrist pain   2. Other closed intra-articular fracture of distal end of left radius, initial encounter    I have personally viewed the imaging studies ordered this visit. Distal radius fracture; subtle.  Splint applied by orthopaedic tech.  OTC analgesics as needed.  Orders Placed This Encounter  Procedures   DG Wrist Complete Left   Apply splint short arm   Apply Shoulder Immobilizer/Sling    Recommend:  Follow-up Information     Schedule an appointment as soon as possible for a visit  with Roseanne Kaufman, MD.   Specialty: Orthopedic Surgery Contact information: [address] [place] [address] [place] [address] [phone]                 Reviewed expectations re: course of current medical issues. Questions answered. Outlined signs and symptoms indicating need for more acute intervention. Patient verbalized understanding. After Visit Summary given.  SUBJECTIVE: History from: patient. Frederick Hayes is a 55 y.o. male who reports fall onto L wrist; yesterday; gradual pain and swelling; worse with movement. No extremity sensation changes or weakness. No tx PTA. No head injury.  Past Surgical History:  Procedure Laterality Date   VASECTOMY        OBJECTIVE:  Vitals:   01/10/21 0931  BP: 126/75  Pulse: 61  Resp: 18  Temp: 98.1 F (36.7 C)  TempSrc: Oral  SpO2: 99%    General appearance: alert; no distress HEENT: [place]; AT Neck: supple with FROM Resp: unlabored respirations Extremities: LUE: warm with well perfused appearance; poorly localized moderate tenderness over left distal radial wrist; with gross deformities; swelling: moderate; bruising: none; wrist ROM: limited by reported pain CV: brisk extremity capillary refill of LUE; 2+ radial pulse of LUE. Skin: warm and dry; no visible rashes Neurologic: gait normal; normal sensation and strength of  LUE Psychological: alert and cooperative; normal mood and affect  Imaging: DG Wrist Complete Left  Result Date: 01/10/2021 CLINICAL DATA:  Left wrist swelling after fall last night. EXAM: LEFT WRIST - COMPLETE 3+ VIEW COMPARISON:  None. FINDINGS: There appears to be a mildly displaced fracture involving the distal left radius with intra-articular extension. The ulna is unremarkable. IMPRESSION: Probable mildly displaced fracture of distal left radius with intra-articular extension. CT scan may be performed for further evaluation. Electronically Signed   By: Marijo Conception M.D.   On: 01/10/2021 10:10      No Known Allergies  Past Medical History:  Diagnosis Date   Hypogonadism in male    Social History   Socioeconomic History   Marital status: Married    Spouse name: Not on file   Number of children: Not on file   Years of education: Not on file   Highest education level: Not on file  Occupational History   Not on file  Tobacco Use   Smoking status: Former    Types: Cigarettes    Quit date: 05/29/1990    Years since quitting: 30.6   Smokeless tobacco: Never  Substance and Sexual Activity   Alcohol use: No   Drug use: No   Sexual activity: Yes    Birth control/protection: Surgical    Comment: Married  Other Topics Concern   Not on file  Social History Narrative   Not on file   Social Determinants of Health   Financial Resource Strain: Not on file  Food Insecurity: Not on file  Transportation Needs: Not on file  Physical Activity: Not on file  Stress: Not on file  Social Connections: Not on file   Family History  Problem Relation Age of Onset   Hyperlipidemia Father    Hypertension Father    Kidney disease Father    Stroke Father    Heart disease Father    Hypertension Brother    Heart disease Paternal Grandmother    Heart disease Paternal Grandfather    Past Surgical History:  Procedure Laterality Date   Lady Deutscher, MD 01/10/21  1047

## 2021-01-10 NOTE — Progress Notes (Signed)
Orthopedic Tech Progress Note Patient Details:  Frederick Hayes 02-10-1966 [id number]  Patient had on wedding ring. Was not able to remove it, Went and got the MD. MD had a conversation with patient and decided to apply ACE WRAP and a REMOVABLE WRIST SPLINT instead of [place].  Ortho Devices Type of Ortho Device: Velcro wrist splint, Ace wrap Ortho Device/Splint Location: LUE Ortho Device/Splint Interventions: Application, Adjustment   Post Interventions Patient Tolerated: Well Instructions Provided: Care of [place] 01/10/2021, 11:49 AM

## 2021-01-10 NOTE — Discharge Instructions (Addendum)
If not allergic, you may use over the counter ibuprofen or acetaminophen as needed. ° °

## 2021-01-10 NOTE — ED Triage Notes (Addendum)
Pt states hurting left wrist while playing tennis last night (had a fall). Wrist is swollen, able to move fingers. Color WNL

## 2021-01-11 ENCOUNTER — Encounter: Payer: Self-pay | Admitting: Orthopaedic Surgery

## 2021-01-11 ENCOUNTER — Ambulatory Visit (INDEPENDENT_AMBULATORY_CARE_PROVIDER_SITE_OTHER): Payer: No Typology Code available for payment source | Admitting: Orthopaedic Surgery

## 2021-01-11 DIAGNOSIS — S52502A Unspecified fracture of the lower end of left radius, initial encounter for closed fracture: Secondary | ICD-10-CM | POA: Insufficient documentation

## 2021-01-11 DIAGNOSIS — S52572A Other intraarticular fracture of lower end of left radius, initial encounter for closed fracture: Secondary | ICD-10-CM | POA: Diagnosis not present

## 2021-01-11 NOTE — Progress Notes (Signed)
Office Visit Note   Patient: Frederick Hayes           Date of Birth: 10-05-1965           MRN: [id number] Visit Date: 01/11/2021              Requested by: Susy Frizzle, MD [address] [place],  [place] [address] PCP: Susy Frizzle, MD   Assessment & Plan: Visit Diagnoses:  1. Other closed intra-articular fracture of distal end of left radius, initial encounter     Plan: Cut Ace wrap removed he can continue with the wrist splint he can move to gently wash his hand when he showers.  Recheck 2 weeks AP lateral x-ray left wrist on return.  Follow-Up Instructions: No follow-ups on file.   Orders:  No orders of the defined types were placed in this encounter.  No orders of the defined types were placed in this encounter.     Procedures: No procedures performed   Clinical Data: No additional findings.   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  He continued playing but had gradual increase pain in left wrist.  He was seen [facility].  He has a wedding ring on his ring finger they were unable to remove despite using soap.  X-rays demonstrated nondisplaced distal radius fracture with intra-articular extension.  He is placed in Ace wrap and Velcro.  Review of Systems all other systems noncontributory to HPI.   Objective: Vital Signs: There were no vitals taken for this visit.  Physical Exam Constitutional:      Appearance: He is well-developed.  HENT:     Head: Normocephalic and atraumatic.     Right Ear: External ear normal.     Left Ear: External ear normal.  Eyes:     Pupils: Pupils are equal, round, and reactive to light.  Neck:     Thyroid: No thyromegaly.     Trachea: No tracheal deviation.  Cardiovascular:     Rate and Rhythm: Normal rate.  Pulmonary:     Effort: Pulmonary effort is normal.      Breath sounds: No wheezing.  Abdominal:     General: Bowel sounds are normal.     Palpations: Abdomen is soft.  Musculoskeletal:     Cervical back: Neck supple.  Skin:    General: Skin is warm and dry.     Capillary Refill: Capillary refill takes less than 2 seconds.  Neurological:     Mental Status: He is alert and oriented to person, place, and time.  Psychiatric:        Behavior: Behavior normal.        Thought Content: Thought content normal.        Judgment: Judgment normal.    Ortho Exam sensations intact in the finger.  Ring is removed using the string technique with soap lubrication.  Swelling of the left wrist and fingers noted.  Specialty Comments:  No specialty comments available.  Imaging: No results found.   PMFS History: Patient Active Problem List   Diagnosis Date Noted   Distal radius fracture, left 01/11/2021   Hypogonadism in male    Past Medical History:  Diagnosis Date   Hypogonadism in male     Family History  Problem Relation Age of Onset   Hyperlipidemia  Father    Hypertension Father    Kidney disease Father    Stroke Father    Heart disease Father    Hypertension Brother    Heart disease Paternal Grandmother    Heart disease Paternal Grandfather     Past Surgical History:  Procedure Laterality Date   VASECTOMY     Social History   Occupational History   Not on file  Tobacco Use   Smoking status: Former    Types: Cigarettes    Quit date: 05/29/1990    Years since quitting: 30.6   Smokeless tobacco: Never  Substance and Sexual Activity   Alcohol use: No   Drug use: No   Sexual activity: Yes    Birth control/protection: Surgical    Comment: Married

## 2021-01-25 ENCOUNTER — Ambulatory Visit: Payer: PRIVATE HEALTH INSURANCE | Admitting: Orthopaedic Surgery

## 2021-01-25 ENCOUNTER — Encounter: Payer: Self-pay | Admitting: Orthopaedic Surgery

## 2021-01-25 ENCOUNTER — Ambulatory Visit (INDEPENDENT_AMBULATORY_CARE_PROVIDER_SITE_OTHER): Payer: No Typology Code available for payment source | Admitting: Orthopaedic Surgery

## 2021-01-25 ENCOUNTER — Ambulatory Visit: Payer: Self-pay

## 2021-01-25 ENCOUNTER — Other Ambulatory Visit: Payer: Self-pay

## 2021-01-25 VITALS — BP 131/84 | HR 52 | Ht 70.0 in | Wt 195.0 lb

## 2021-01-25 DIAGNOSIS — S52572A Other intraarticular fracture of lower end of left radius, initial encounter for closed fracture: Secondary | ICD-10-CM

## 2021-01-25 NOTE — Progress Notes (Signed)
Office Visit Note   Patient: Frederick Hayes           Date of Birth: 1965-11-13           MRN: [id number] Visit Date: 01/25/2021              Requested by: Susy Frizzle, MD [address] [place],  [place] [address] PCP: Susy Frizzle, MD   Assessment & Plan: Visit Diagnoses:  1. Other closed intra-articular fracture of distal end of left radius, initial encounter     Plan: X-rays demonstrate nondisplaced fracture again with some sclerotic healing adjacent to fracture.  He can discontinue the brace in a few weeks.  Continue to remember to work on gentle wrist range of motion when he days washes his hands.He can return if he has increased problems.  Follow-Up Instructions: No follow-ups on file.   Orders:  Orders Placed This Encounter  Procedures   XR Wrist 2 Views Left   No orders of the defined types were placed in this encounter.     Procedures: No procedures performed   Clinical Data: No additional findings.   Subjective: Chief Complaint  Patient presents with   Left Wrist - Fracture, Follow-up    HPI 55 year old male returns post follow-up wrist injury left nondominant wrist when he fell playing tennis.  He is wearing splint splint on his wrist removes it for bathing.  He has played tennis and has not had problems serving with his left hand with his wrist in the splint.  Denies pain good finger range of motion.  Review of Systems all the systems update unchanged.   Objective: Vital Signs: BP 131/84   Pulse (!) 52   Ht '5\' 10"'$  (1.778 m)   Wt 195 lb (88.5 kg)   BMI 27.98 kg/m   Physical Exam Constitutional:      Appearance: He is well-developed.  HENT:     Head: Normocephalic and atraumatic.     Right Ear: External ear normal.     Left Ear: External ear normal.  Eyes:     Pupils: Pupils are equal, round, and reactive to light.  Neck:     Thyroid: No thyromegaly.     Trachea: No tracheal deviation.  Cardiovascular:     Rate and  Rhythm: Normal rate.  Pulmonary:     Effort: Pulmonary effort is normal.     Breath sounds: No wheezing.  Abdominal:     General: Bowel sounds are normal.     Palpations: Abdomen is soft.  Musculoskeletal:     Cervical back: Neck supple.  Skin:    General: Skin is warm and dry.     Capillary Refill: Capillary refill takes less than 2 seconds.  Neurological:     Mental Status: He is alert and oriented to person, place, and time.  Psychiatric:        Behavior: Behavior normal.        Thought Content: Thought content normal.        Judgment: Judgment normal.    Ortho Exam no swelling of the digits good range of motion.  No ecchymosis.  Specialty Comments:  No specialty comments available.  Imaging: No results found.   PMFS History: Patient Active Problem List   Diagnosis Date Noted   Distal radius fracture, left 01/11/2021   Hypogonadism in male    Past Medical History:  Diagnosis Date   Hypogonadism in male     Family History  Problem Relation Age of Onset   Hyperlipidemia Father    Hypertension Father    Kidney disease Father    Stroke Father    Heart disease Father    Hypertension Brother    Heart disease Paternal Grandmother    Heart disease Paternal Grandfather     Past Surgical History:  Procedure Laterality Date   VASECTOMY     Social History   Occupational History   Not on file  Tobacco Use   Smoking status: Former    Types: Cigarettes    Quit date: 05/29/1990    Years since quitting: 30.6   Smokeless tobacco: Never  Substance and Sexual Activity   Alcohol use: No   Drug use: No   Sexual activity: Yes    Birth control/protection: Surgical    Comment: Married

## 2021-02-14 ENCOUNTER — Other Ambulatory Visit: Payer: Self-pay

## 2021-02-14 ENCOUNTER — Ambulatory Visit: Payer: No Typology Code available for payment source | Admitting: Family Medicine

## 2021-02-14 VITALS — BP 138/80 | HR 59 | Temp 98.7°F | Resp 18 | Wt 198.0 lb

## 2021-02-14 DIAGNOSIS — E291 Testicular hypofunction: Secondary | ICD-10-CM

## 2021-02-14 NOTE — Progress Notes (Signed)
Subjective:    Patient ID: Frederick Hayes, male    DOB: 04-09-1966, 55 y.o.   MRN: [id number]  HPI  Patient is here today for a follow-up of his hypogonadism.  He is currently on testosterone gel 1.62%, 2 pumps on the skin daily.  He states that the medication is helping him substantially.  He feels much better with more energy and more drive when he uses the testosterone.  Without the testosterone, he reports having poor libido, decreased energy, early fatigue.  Therefore he would like to continue the medicine however he is here today just to screen for any complications such as a rising PSA or polycythemia.  Overall he is doing well.  He does have some pain in his left wrist stemming from a fracture that occurred 6 weeks ago.  He also has some numbness in his fingers and his left hand which I believe is a posttraumatic carpal tunnel syndrome likely due to swelling in the wrist from the fracture.  Otherwise he is doing well with no concerns Past Medical History:  Diagnosis Date   Hypogonadism in male    Past Surgical History:  Procedure Laterality Date   VASECTOMY     Current Outpatient Medications on File Prior to Visit  Medication Sig Dispense Refill   Testosterone 20.25 MG/ACT (1.62%) GEL PLACE 2 PUMPS ONTO THE SKIN DAILY 75 g 5   sildenafil (VIAGRA) 100 MG tablet Take 0.5-1 tablets (50-100 mg total) by mouth daily as needed for erectile dysfunction. 30 tablet 3   No current facility-administered medications on file prior to visit.   No Known Allergies Social History   Socioeconomic History   Marital status: Married    Spouse name: Not on file   Number of children: Not on file   Years of education: Not on file   Highest education level: Not on file  Occupational History   Not on file  Tobacco Use   Smoking status: Former    Types: Cigarettes    Quit date: 05/29/1990    Years since quitting: 30.7   Smokeless tobacco: Never  Substance and Sexual Activity   Alcohol use: No    Drug use: No   Sexual activity: Yes    Birth control/protection: Surgical    Comment: Married  Other Topics Concern   Not on file  Social History Narrative   Not on file   Social Determinants of Health   Financial Resource Strain: Not on file  Food Insecurity: Not on file  Transportation Needs: Not on file  Physical Activity: Not on file  Stress: Not on file  Social Connections: Not on file  Intimate Partner Violence: Not on file      Review of Systems  All other systems reviewed and are negative.     Objective:   Physical Exam Vitals reviewed.  Cardiovascular:     Rate and Rhythm: Normal rate and regular rhythm.     Heart sounds: Normal heart sounds. No murmur heard.   No friction rub. No gallop.  Pulmonary:     Effort: Pulmonary effort is normal. No respiratory distress.     Breath sounds: Normal breath sounds. No wheezing or rales.  Abdominal:     General: Bowel sounds are normal. There is no distension.     Palpations: Abdomen is soft.     Tenderness: There is no abdominal tenderness. There is no rebound.          Assessment & Plan:   Hypogonadism  in male - Plan: CBC with Differential/Platelet, COMPLETE METABOLIC PANEL WITH GFR, PSA, CANCELED: Testosterone Total,Free,Bio, Males  Symptomatically he is doing well.  I will check a CBC today to rule out polycythemia and I will check a PSA to rule out a sudden rise in the PSA.  I canceled a testosterone level because he is been off the medication now for 2 to 3 weeks so I feel that it will be low.  When he uses the cream, he feels good so I would not uptitrate based on the lab result therefore I see no reason to check that value.

## 2021-02-15 LAB — COMPLETE METABOLIC PANEL WITH GFR
AG Ratio: 1.8 (calc) (ref 1.0–2.5)
ALT: 17 U/L (ref 9–46)
AST: 15 U/L (ref 10–35)
Albumin: 4.4 g/dL (ref 3.6–5.1)
Alkaline phosphatase (APISO): 51 U/L (ref 35–144)
BUN: 18 mg/dL (ref 7–25)
CO2: 24 mmol/L (ref 20–32)
Calcium: 9.4 mg/dL (ref 8.6–10.3)
Chloride: 106 mmol/L (ref 98–110)
Creat: 1.24 mg/dL (ref 0.70–1.30)
Globulin: 2.5 g/dL (calc) (ref 1.9–3.7)
Glucose, Bld: 87 mg/dL (ref 65–99)
Potassium: 4.1 mmol/L (ref 3.5–5.3)
Sodium: 141 mmol/L (ref 135–146)
Total Bilirubin: 0.5 mg/dL (ref 0.2–1.2)
Total Protein: 6.9 g/dL (ref 6.1–8.1)
eGFR: 69 mL/min/{1.73_m2} (ref >=60)

## 2021-02-15 LAB — CBC WITH DIFFERENTIAL/PLATELET
Absolute Monocytes: 1009 cells/uL — ABNORMAL HIGH (ref 200–950)
Basophils Absolute: 94 cells/uL (ref 0–200)
Basophils Relative: 0.9 %
Eosinophils Absolute: 177 cells/uL (ref 15–500)
Eosinophils Relative: 1.7 %
HCT: 49.2 % (ref 38.5–50.0)
Hemoglobin: 16.5 g/dL (ref 13.2–17.1)
Lymphs Abs: 1643 cells/uL (ref 850–3900)
MCH: 32.7 pg (ref 27.0–33.0)
MCHC: 33.5 g/dL (ref 32.0–36.0)
MCV: 97.6 fL (ref 80.0–100.0)
MPV: 11 fL (ref 7.5–12.5)
Monocytes Relative: 9.7 %
Neutro Abs: 7478 cells/uL (ref 1500–7800)
Neutrophils Relative %: 71.9 %
Platelets: 273 10*3/uL (ref 140–400)
RBC: 5.04 10*6/uL (ref 4.20–5.80)
RDW: 12.9 % (ref 11.0–15.0)
Total Lymphocyte: 15.8 %
WBC: 10.4 10*3/uL (ref 3.8–10.8)

## 2021-02-15 LAB — PSA: PSA: 3.46 ng/mL (ref <=4.00)

## 2021-02-21 ENCOUNTER — Encounter: Payer: Self-pay | Admitting: Family Medicine

## 2021-02-21 ENCOUNTER — Other Ambulatory Visit: Payer: Self-pay

## 2021-02-21 ENCOUNTER — Ambulatory Visit (INDEPENDENT_AMBULATORY_CARE_PROVIDER_SITE_OTHER): Payer: No Typology Code available for payment source | Admitting: Family Medicine

## 2021-02-21 VITALS — BP 126/72 | HR 66 | Temp 99.1°F | Resp 16 | Ht 70.0 in | Wt 198.0 lb

## 2021-02-21 DIAGNOSIS — E291 Testicular hypofunction: Secondary | ICD-10-CM | POA: Diagnosis not present

## 2021-02-21 DIAGNOSIS — R972 Elevated prostate specific antigen [PSA]: Secondary | ICD-10-CM | POA: Diagnosis not present

## 2021-02-21 DIAGNOSIS — N402 Nodular prostate without lower urinary tract symptoms: Secondary | ICD-10-CM | POA: Diagnosis not present

## 2021-02-21 DIAGNOSIS — D229 Melanocytic nevi, unspecified: Secondary | ICD-10-CM | POA: Diagnosis not present

## 2021-02-21 NOTE — Progress Notes (Addendum)
   Subjective:    Patient ID: Frederick Hayes, male    DOB: [birth year], 55 y.o.   MRN: [id number]  HPI   Patient is currently on testosterone for hypogonadism.  On his last lab work, his PSA was found to have jumped considerably.  PSA climbed from 1.78 in March to 3.46 in September.  Therefore doubled in just 6 months.  Previously had been around 1.5.  Therefore I asked the patient to discontinue his testosterone and come in for a digital rectal exam.  Past Medical History:  Diagnosis Date   Hypogonadism in male    Past Surgical History:  Procedure Laterality Date   VASECTOMY     Current Outpatient Medications on File Prior to Visit  Medication Sig Dispense Refill   Testosterone 20.25 MG/ACT (1.62%) GEL PLACE 2 PUMPS ONTO THE SKIN DAILY 75 g 5   sildenafil (VIAGRA) 100 MG tablet Take 0.5-1 tablets (50-100 mg total) by mouth daily as needed for erectile dysfunction. 30 tablet 3   No current facility-administered medications on file prior to visit.   No Known Allergies Social History   Socioeconomic History   Marital status: Married    Spouse name: Not on file   Number of children: Not on file   Years of education: Not on file   Highest education level: Not on file  Occupational History   Not on file  Tobacco Use   Smoking status: Former    Types: Cigarettes    Quit date: 05/29/1990    Years since quitting: 30.7   Smokeless tobacco: Never  Substance and Sexual Activity   Alcohol use: No   Drug use: No   Sexual activity: Yes    Birth control/protection: Surgical    Comment: Married  Other Topics Concern   Not on file  Social History Narrative   Not on file   Social Determinants of Health   Financial Resource Strain: Not on file  Food Insecurity: Not on file  Transportation Needs: Not on file  Physical Activity: Not on file  Stress: Not on file  Social Connections: Not on file  Intimate Partner Violence: Not on file      Review of Systems  All other systems  reviewed and are negative.     Objective:   Physical Exam Vitals reviewed.  Constitutional:      Appearance: Normal appearance. He is normal weight.  Cardiovascular:     Rate and Rhythm: Normal rate and regular rhythm.     Heart sounds: Normal heart sounds. No murmur heard.   No friction rub. No gallop.  Pulmonary:     Effort: Pulmonary effort is normal. No respiratory distress.     Breath sounds: Normal breath sounds. No wheezing or rales.  Genitourinary:    Prostate: Enlarged and nodules present.  Neurological:     Mental Status: He is alert.     Black mole above gluteal cleft.     Assessment & Plan:  Elevated PSA  Hypogonadism in male  Prostate is enlarged.  There are some nodularity on the right side of the prostate.  It feels asymmetric.  Therefore I recommend urology consultation to discuss a biopsy.  Also there is an abnormal mole on his lower back just above the gluteal cleft.  I have recommended a dermatology consultation for a biopsy

## 2021-02-21 NOTE — Addendum Note (Signed)
Addended by: Jenna Luo T on: 02/21/2021 09:03 AM   Modules accepted: Orders

## 2021-03-01 ENCOUNTER — Ambulatory Visit (INDEPENDENT_AMBULATORY_CARE_PROVIDER_SITE_OTHER): Payer: No Typology Code available for payment source | Admitting: Urology

## 2021-03-01 ENCOUNTER — Other Ambulatory Visit: Payer: Self-pay

## 2021-03-01 ENCOUNTER — Encounter: Payer: Self-pay | Admitting: Urology

## 2021-03-01 VITALS — BP 158/83 | HR 58

## 2021-03-01 DIAGNOSIS — R972 Elevated prostate specific antigen [PSA]: Secondary | ICD-10-CM | POA: Diagnosis not present

## 2021-03-01 DIAGNOSIS — E291 Testicular hypofunction: Secondary | ICD-10-CM | POA: Diagnosis not present

## 2021-03-01 LAB — URINALYSIS, ROUTINE W REFLEX MICROSCOPIC
Bilirubin, UA: NEGATIVE
Glucose, UA: NEGATIVE
Ketones, UA: NEGATIVE
Leukocytes,UA: NEGATIVE
Nitrite, UA: NEGATIVE
RBC, UA: NEGATIVE
Specific Gravity, UA: 1.02 (ref 1.005–1.030)
Urobilinogen, Ur: 0.2 mg/dL (ref 0.2–1.0)
pH, UA: 7 (ref 5.0–7.5)

## 2021-03-01 NOTE — Progress Notes (Signed)

## 2021-03-01 NOTE — Progress Notes (Signed)
Assessment: 1. Rising PSA level   2. Hypogonadism in male      Plan: I reviewed his medical records including recent PSA results. Today I had a long discussion with the patient regarding PSA and the rationale and controversies of prostate cancer early detection.  I discussed the pros and cons of further evaluation including TRUS and prostate Bx.  Potential adverse events and complications as well as standard instructions were given.  Patient expressed his understanding of these issues. PSA, testosterone level today Will contact him with results and further recommendations Agree with holding on TRT at this time.  Chief Complaint:  Chief Complaint  Patient presents with   Elevated PSA    History of Present Illness:  Frederick Hayes is a 55 y.o. year old male who is seen in consultation from Susy Frizzle, MD for evaluation of rising PSA.   PSA results: 6/21 1.4 9/21 1.42 3/22 1.78 9/22 3.46  No prior prostate biopsy.  No history of prostatitis or UTIs.  No family history of prostate cancer.  He has been on testosterone replacement therapy with AndroGel for management of hypogonadism.  This was recently discontinued due to the rising PSA.  He reports feeling improvement symptomatically while on testosterone replacement therapy. He does not have significant lower urinary tract symptoms.  He does have nocturia 1-2 times per night and occasional postvoid dribbling.  No dysuria or gross hematuria. AUA score = 4 today.   Past Medical History:  Past Medical History:  Diagnosis Date   Hypogonadism in male     Past Surgical History:  Past Surgical History:  Procedure Laterality Date   VASECTOMY      Allergies:  No Known Allergies  Family History:  Family History  Problem Relation Age of Onset   Hyperlipidemia Father    Hypertension Father    Kidney disease Father    Stroke Father    Heart disease Father    Hypertension Brother    Heart disease Paternal  Grandmother    Heart disease Paternal Grandfather     Social History:  Social History   Tobacco Use   Smoking status: Former    Types: Cigarettes    Quit date: 05/29/1990    Years since quitting: 30.7   Smokeless tobacco: Never  Substance Use Topics   Alcohol use: No   Drug use: No    Review of symptoms:  Constitutional:  Negative for unexplained weight loss, night sweats, fever, chills ENT:  Negative for nose bleeds, sinus pain, painful swallowing CV:  Negative for chest pain, shortness of breath, exercise intolerance, palpitations, loss of consciousness Resp:  Negative for cough, wheezing, shortness of breath GI:  Negative for nausea, vomiting, diarrhea, bloody stools GU:  Positives noted in HPI; otherwise negative for gross hematuria, dysuria, urinary incontinence Neuro:  Negative for seizures, poor balance, limb weakness, slurred speech Psych:  Negative for lack of energy, depression, anxiety Endocrine:  Negative for polydipsia, polyuria, symptoms of hypoglycemia (dizziness, hunger, sweating) Hematologic:  Negative for anemia, purpura, petechia, prolonged or excessive bleeding, use of anticoagulants  Allergic:  Negative for difficulty breathing or choking as a result of exposure to anything; no shellfish allergy; no allergic response (rash/itch) to materials, foods  Physical exam: BP (!) 158/83   Pulse (!) 58  GENERAL APPEARANCE:  Well appearing, well developed, well nourished, NAD HEENT: Atraumatic, Normocephalic, oropharynx clear. NECK: Supple without lymphadenopathy or thyromegaly. LUNGS: Clear to auscultation bilaterally. HEART: Regular Rate and Rhythm without murmurs, gallops,  or rubs. ABDOMEN: Soft, non-tender, No Masses. EXTREMITIES: Moves all extremities well.  Without clubbing, cyanosis, or edema. NEUROLOGIC:  Alert and oriented x 3, normal gait, CN II-XII grossly intact.  MENTAL STATUS:  Appropriate. BACK:  Non-tender to palpation.  No CVAT SKIN:  Warm, dry and  intact.  GU: Penis:  circumcised Meatus: Normal Scrotum: normal, no masses Testis: normal without masses bilateral Epididymis: normal Prostate: 40 g, NT, small mid-line nodule, likely calcification Rectum: Normal tone, normal prostate, no masses or tenderness    Results: Results for orders placed or performed in visit on 03/01/21 (from the past 24 hour(s))  Urinalysis, Routine w reflex microscopic     Status: Abnormal   Collection Time: 03/01/21 10:45 AM  Result Value Ref Range   Specific Gravity, UA 1.020 1.005 - 1.030   pH, UA 7.0 5.0 - 7.5   Color, UA Yellow Yellow   Appearance Ur Clear Clear   Leukocytes,UA Negative Negative   Protein,UA 1+ (A) Negative/Trace   Glucose, UA Negative Negative   Ketones, UA Negative Negative   RBC, UA Negative Negative   Bilirubin, UA Negative Negative   Urobilinogen, Ur 0.2 0.2 - 1.0 mg/dL   Nitrite, UA Negative Negative   Microscopic Examination Comment    Narrative   Performed at:  [facility] [address] [place], [place]  [id number] Lab Director: [place], Phone:  [phone]

## 2021-03-02 LAB — PSA: Prostate Specific Ag, Serum: 2.6 ng/mL (ref 0.0–4.0)

## 2021-03-02 LAB — TESTOSTERONE: Testosterone: 398 ng/dL (ref 264–916)

## 2021-03-08 ENCOUNTER — Telehealth: Payer: Self-pay

## 2021-03-08 DIAGNOSIS — R972 Elevated prostate specific antigen [PSA]: Secondary | ICD-10-CM

## 2021-03-08 NOTE — Telephone Encounter (Signed)
Patient called and notified of results and prostate biopsy instructions reviewed and sent via my chart for patient.  Appt scheduled with patient. Voiced understanding.

## 2021-03-08 NOTE — Telephone Encounter (Signed)
-----   Message from Primus Bravo, MD sent at 03/03/2021  1:59 PM EDT ----- Patient aware of results. Please schedule patient for prostate U/S and biopsy.  Please contact him with information about procedure.

## 2021-03-22 NOTE — Progress Notes (Signed)
Assessment: 1. Elevated PSA     Plan: Post biopsy instructions given. Return to office in 1 week to discuss biopsy results.  Chief Complaint: Chief Complaint  Patient presents with   Elevated PSA     HPI: Frederick Hayes is a 55 y.o. male who presents for continued evaluation of a rising PSA.   PSA results: 6/21     1.4 9/21     1.42 3/22     1.78 9/22     3.46 10/22 2.6  (off TRT)   No prior prostate biopsy.  No history of prostatitis or UTIs.  No family history of prostate cancer.   He has been on testosterone replacement therapy with AndroGel for management of hypogonadism.  This was recently discontinued due to the rising PSA.  He reports feeling improvement symptomatically while on testosterone replacement therapy. He does not have significant lower urinary tract symptoms.  He does have nocturia 1-2 times per night and occasional postvoid dribbling.  No dysuria or gross hematuria. AUA score = 4.  He presents today for a prostate biopsy.   Portions of the above documentation were copied from a prior visit for review purposes only.  Allergies: No Known Allergies  PMH: Past Medical History:  Diagnosis Date   Hypogonadism in male     PSH: Past Surgical History:  Procedure Laterality Date   VASECTOMY      SH: Social History   Tobacco Use   Smoking status: Former    Types: Cigarettes    Quit date: 05/29/1990    Years since quitting: 30.8   Smokeless tobacco: Never  Vaping Use   Vaping Use: Never used  Substance Use Topics   Alcohol use: No   Drug use: No    ROS: Constitutional:  Negative for fever, chills, weight loss CV: Negative for chest pain, previous MI, hypertension Respiratory:  Negative for shortness of breath, wheezing, sleep apnea, frequent cough GI:  Negative for nausea, vomiting, bloody stool, GERD  PE: GENERAL APPEARANCE:  Well appearing, well developed, well nourished, NAD HEENT:  Atraumatic, normocephalic, oropharynx  clear NECK:  Supple without lymphadenopathy or thyromegaly ABDOMEN:  Soft, non-tender, no masses EXTREMITIES:  Moves all extremities well, without clubbing, cyanosis, or edema NEUROLOGIC:  Alert and oriented x 3, normal gait, CN II-XII grossly intact MENTAL STATUS:  appropriate BACK:  Non-tender to palpation, No CVAT SKIN:  Warm, dry, and intact   Results: None  TRANSRECTAL ULTRASOUND AND PROSTATE BIOPSY  Indication:  Elevated PSA  Prophylactic antibiotic administration: Rocephin  All medications that could result in increased bleeding were discontinued within an appropriate period of the time of biopsy.  Risk including bleeding and infection were discussed.  Informed consent was obtained.  The patient was placed in the left lateral decubitus position.  PROCEDURE 1.  TRANSRECTAL ULTRASOUND OF THE PROSTATE  The 7 MHz transrectal probe was used to image the prostate.  Anal stenosis was not noted.  TRUS volume: 28.8 ml  Hypoechoic areas: None  Hyperechoic areas: None  Central calcifications: not present  Margins:  normal   PROCEDURE 2:  PROSTATE BIOPSY  A periprostatic block was performed using 1% lidocaine and transrectal ultrasound guidance. Under transrectal ultrasound guidance, and using the Biopty gun, prostate biopsies were obtained systematically from the apex, mid gland, and base bilaterally.  A total of 12 cores were obtained.  Hemostasis was obtained with gentle pressure on the prostate.  The procedures were well-tolerated.  No significant bleeding was noted at  the end of the procedure.  The patient was stable for discharge from the office.

## 2021-03-23 ENCOUNTER — Ambulatory Visit (HOSPITAL_COMMUNITY)
Admission: RE | Admit: 2021-03-23 | Discharge: 2021-03-23 | Disposition: A | Discharge status: Home / Self Care | Payer: No Typology Code available for payment source | Source: Ambulatory Visit | Attending: Urology | Admitting: Urology

## 2021-03-23 ENCOUNTER — Ambulatory Visit (INDEPENDENT_AMBULATORY_CARE_PROVIDER_SITE_OTHER): Payer: No Typology Code available for payment source | Admitting: Urology

## 2021-03-23 ENCOUNTER — Encounter (HOSPITAL_COMMUNITY): Payer: Self-pay

## 2021-03-23 ENCOUNTER — Other Ambulatory Visit: Payer: Self-pay | Admitting: Urology

## 2021-03-23 ENCOUNTER — Other Ambulatory Visit: Payer: Self-pay

## 2021-03-23 ENCOUNTER — Encounter: Payer: Self-pay | Admitting: Urology

## 2021-03-23 DIAGNOSIS — R896 Abnormal cytological findings in specimens from other organs, systems and tissues: Secondary | ICD-10-CM | POA: Diagnosis not present

## 2021-03-23 DIAGNOSIS — R972 Elevated prostate specific antigen [PSA]: Secondary | ICD-10-CM | POA: Diagnosis not present

## 2021-03-23 DIAGNOSIS — Z87891 Personal history of nicotine dependence: Secondary | ICD-10-CM | POA: Insufficient documentation

## 2021-03-23 MED ORDER — LIDOCAINE HCL (PF) 2 % IJ SOLN
INTRAMUSCULAR | Status: AC
  Filled 2021-03-23: qty 20

## 2021-03-23 MED ORDER — CEFTRIAXONE SODIUM 1 G IJ SOLR: 1.0000 g | Freq: Once | INTRAMUSCULAR | Status: AC

## 2021-03-23 MED ORDER — LIDOCAINE HCL (PF) 2 % IJ SOLN
INTRAMUSCULAR | Status: AC
  Administered 2021-03-23: 10 mL
  Filled 2021-03-23: qty 10

## 2021-03-23 MED ORDER — LIDOCAINE HCL (PF) 1 % IJ SOLN: 2.1000 mL | Freq: Once | INTRAMUSCULAR | Status: AC

## 2021-03-23 MED ORDER — CEFTRIAXONE SODIUM 1 G IJ SOLR
INTRAMUSCULAR | Status: AC
  Administered 2021-03-23: 1 g via INTRAMUSCULAR
  Filled 2021-03-23: qty 10

## 2021-03-23 MED ORDER — LIDOCAINE HCL (PF) 1 % IJ SOLN
INTRAMUSCULAR | Status: AC
  Administered 2021-03-23: 2.1 mL via INTRADERMAL
  Filled 2021-03-23: qty 5

## 2021-03-23 MED ORDER — LIDOCAINE HCL (PF) 2 % IJ SOLN: 10.0000 mL | Freq: Once | INTRAMUSCULAR | Status: AC

## 2021-03-23 NOTE — Progress Notes (Signed)
PT tolerated prostate biopsy procedure and antibiotic injection well today. Labs obtained and sent for pathology. PT ambulatory at discharge with no acute distress noted and verbalized understanding of discharge instructions.  

## 2021-03-29 ENCOUNTER — Ambulatory Visit: Payer: No Typology Code available for payment source | Admitting: Urology

## 2021-06-20 ENCOUNTER — Other Ambulatory Visit: Payer: Self-pay

## 2021-06-20 ENCOUNTER — Ambulatory Visit (INDEPENDENT_AMBULATORY_CARE_PROVIDER_SITE_OTHER): Payer: No Typology Code available for payment source | Admitting: Urology

## 2021-06-20 ENCOUNTER — Encounter: Payer: Self-pay | Admitting: Urology

## 2021-06-20 VITALS — BP 148/88 | HR 57 | Wt 200.7 lb

## 2021-06-20 DIAGNOSIS — N4232 Atypical small acinar proliferation of prostate: Secondary | ICD-10-CM | POA: Diagnosis not present

## 2021-06-20 DIAGNOSIS — R972 Elevated prostate specific antigen [PSA]: Secondary | ICD-10-CM | POA: Diagnosis not present

## 2021-06-20 LAB — URINALYSIS, ROUTINE W REFLEX MICROSCOPIC
Bilirubin, UA: NEGATIVE
Glucose, UA: NEGATIVE
Ketones, UA: NEGATIVE
Leukocytes,UA: NEGATIVE
Nitrite, UA: NEGATIVE
Protein,UA: NEGATIVE
Specific Gravity, UA: 1.025 (ref 1.005–1.030)
Urobilinogen, Ur: 0.2 mg/dL (ref 0.2–1.0)
pH, UA: 6 (ref 5.0–7.5)

## 2021-06-20 LAB — MICROSCOPIC EXAMINATION
Bacteria, UA: NONE SEEN
Epithelial Cells (non renal): NONE SEEN /hpf (ref 0–10)
RBC, Urine: NONE SEEN /hpf (ref 0–2)
Renal Epithel, UA: NONE SEEN /hpf
WBC, UA: NONE SEEN /hpf (ref 0–5)

## 2021-06-20 NOTE — Progress Notes (Signed)
Assessment: 1. 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  The findings of ASAP on the biopsy and potential clinical significance discussed in detail.  I discussed options for further evaluation including repeat testing with blood or urine based testing, imaging with a prostate MRI, and repeat prostate biopsy. 4K test today Will call with results  I personally spent 30 minutes involved in face to face and non-face-to-face activities for this patient on the day of the visit.  Professional time spent included the following activities, in addition to those noted in the documentation: Review of pathology results, discussion of findings with the patient and recommendations for continued evaluation, discussion of 4K test  Chief Complaint: Chief Complaint  Patient presents with   Elevated PSA     HPI: Frederick Hayes is a 56 y.o. male who presents for continued evaluation of a rising PSA.   PSA results: 6/21     1.4 9/21     1.42 3/22     1.78 9/22     3.46 10/22 2.6  (off TRT)   No prior prostate biopsy.  No history of prostatitis or UTIs.  No family history of prostate cancer.   He has been on testosterone replacement therapy with AndroGel for management of hypogonadism.  This was recently discontinued due to the rising PSA.  He reports feeling improvement symptomatically while on testosterone replacement therapy. He does not have significant lower urinary tract symptoms.  He does have nocturia 1-2 times per night and occasional postvoid dribbling.  No dysuria or gross hematuria. AUA score = 4.  He underwent a prostate ultrasound and biopsy on 03/23/2021. TRUS volume: 28.8 cm Pathology: Small focus of atypical glands left lateral apex  He returns today for follow-up.  No new urinary symptoms.  No dysuria or gross hematuria. IPSS = 3 today. He has not resumed his  testosterone replacement therapy.    Portions of the above documentation were copied from a prior visit for review purposes only.  Allergies: No Known Allergies  PMH: Past Medical History:  Diagnosis Date   Hypogonadism in male     PSH: Past Surgical History:  Procedure Laterality Date   VASECTOMY      SH: Social History   Tobacco Use   Smoking status: Former    Types: Cigarettes    Quit date: 05/29/1990    Years since quitting: 31.0   Smokeless tobacco: Never  Vaping Use   Vaping Use: Never used  Substance Use Topics   Alcohol use: No   Drug use: No    ROS: Constitutional:  Negative for fever, chills, weight loss CV: Negative for chest pain, previous MI, hypertension Respiratory:  Negative for shortness of breath, wheezing, sleep apnea, frequent cough GI:  Negative for nausea, vomiting, bloody stool, GERD  PE: BP (!) 148/88    Pulse (!) 57    Wt 200 lb 11.2 oz (91 kg)    BMI 28.80 kg/m  GENERAL APPEARANCE:  Well appearing, well developed, well nourished, NAD HEENT:  Atraumatic, normocephalic, oropharynx clear NECK:  Supple without lymphadenopathy or thyromegaly ABDOMEN:  Soft, non-tender, no masses EXTREMITIES:  Moves all extremities well, without clubbing, cyanosis, or edema NEUROLOGIC:  Alert and oriented x 3, normal gait, CN II-XII grossly intact MENTAL STATUS:  appropriate BACK:  Non-tender to palpation, No CVAT SKIN:  Warm, dry, and intact  Results: U/A dipstick negative

## 2021-06-20 NOTE — Progress Notes (Addendum)
Tracking# [id number] [employer] tracking- [id number]  Urological Symptom Review  Patient is experiencing the following symptoms: Get up at night to urinate Erection problems (male only)   Review of Systems  Gastrointestinal (upper)  : Negative for upper GI symptoms  Gastrointestinal (lower) : Negative for lower GI symptoms  Constitutional : Negative for symptoms  Skin: Negative for skin symptoms  Eyes: Negative for eye symptoms  Ear/Nose/Throat : Negative for Ear/Nose/Throat symptoms  Hematologic/Lymphatic: Negative for Hematologic/Lymphatic symptoms  Cardiovascular : Negative for cardiovascular symptoms  Respiratory : Negative for respiratory symptoms  Endocrine: Negative for endocrine symptoms  Musculoskeletal: Negative for musculoskeletal symptoms  Neurological: Negative for neurological symptoms  Psychologic: Negative for psychiatric symptoms

## 2021-06-23 ENCOUNTER — Other Ambulatory Visit: Payer: Self-pay | Admitting: Urology

## 2021-06-24 ENCOUNTER — Encounter: Payer: Self-pay | Admitting: Urology

## 2021-12-21 ENCOUNTER — Ambulatory Visit: Payer: No Typology Code available for payment source | Admitting: Urology

## 2022-01-10 ENCOUNTER — Other Ambulatory Visit: Payer: Self-pay | Admitting: Family Medicine

## 2022-01-10 ENCOUNTER — Ambulatory Visit (HOSPITAL_COMMUNITY)
Admission: RE | Admit: 2022-01-10 | Discharge: 2022-01-10 | Disposition: A | Discharge status: Home / Self Care | Payer: No Typology Code available for payment source | Source: Ambulatory Visit | Attending: Physician Assistant | Admitting: Physician Assistant

## 2022-01-10 ENCOUNTER — Ambulatory Visit (INDEPENDENT_AMBULATORY_CARE_PROVIDER_SITE_OTHER): Payer: No Typology Code available for payment source | Admitting: Physician Assistant

## 2022-01-10 VITALS — BP 143/78 | HR 80

## 2022-01-10 DIAGNOSIS — R109 Unspecified abdominal pain: Secondary | ICD-10-CM | POA: Insufficient documentation

## 2022-01-10 DIAGNOSIS — N200 Calculus of kidney: Secondary | ICD-10-CM

## 2022-01-10 DIAGNOSIS — R972 Elevated prostate specific antigen [PSA]: Secondary | ICD-10-CM | POA: Diagnosis not present

## 2022-01-10 DIAGNOSIS — Z87442 Personal history of urinary calculi: Secondary | ICD-10-CM | POA: Diagnosis not present

## 2022-01-10 MED ORDER — OXYCODONE-ACETAMINOPHEN 5-325 MG PO TABS: 1.0000 | ORAL_TABLET | Freq: Four times a day (QID) | ORAL | 0 refills | Status: AC | PRN

## 2022-01-10 NOTE — Progress Notes (Addendum)
Assessment: 1. Kidney stones - Urinalysis, Routine w reflex microscopic - CT RENAL STONE STUDY  2. Flank pain - CT RENAL STONE STUDY  3. Elevated PSA    Plan: Stat CT stone study ordered and the patient advised to continue Flomax and Zofran.  Prescription for Percocet given. Will review CT and plan further tx pending results. Pt to FU for repeat PSA and OV with Dr. Felipa Eth in 2-3 weeks pending need for tx of stones.  Chief Complaint: No chief complaint on file.   HPI: Frederick Hayes is a 56 y.o. male with h/o stone dz who presents for evaluation of 4-day history acute onset right-sided flank pain now radiating to the groin with nausea and vomiting.  Patient is currently on Zofran and Flomax as prescribed by virtual visit with his primary care.  Patient's last stone event was several years ago.  He denies fever, chills, dysuria, urgency, gross hematuria.  No nausea at this time.  Pain is currently mild.  The patient's last stone event was several years ago.  Last abdominal Imaging available for review from 2003 indicated stones in the right kidney.  Additionally, the patient questions whether he is due for PSA due to history of elevated PSAs in the past.  He missed an appointment with Dr. Felipa Eth last month for follow-up. UA = 0-5 WBCs, 0-2 RBCs, hyaline casts, no bacteria, nitrite negative  06/20/21 Frederick Hayes is a 56 y.o. male who presents for continued evaluation of a rising PSA.   PSA results: 6/21     1.4 9/21     1.42 3/22     1.78 9/22     3.46 10/22   2.6  (off TRT)   No prior prostate biopsy.  No history of prostatitis or UTIs.  No family history of prostate cancer.   He has been on testosterone replacement therapy with AndroGel for management of hypogonadism.  This was recently discontinued due to the rising PSA.  He reports feeling improvement symptomatically while on testosterone replacement therapy. He does not have significant lower urinary tract  symptoms.  He does have nocturia 1-2 times per night and occasional postvoid dribbling.  No dysuria or gross hematuria. AUA score = 4.   He underwent a prostate ultrasound and biopsy on 03/23/2021. TRUS volume: 28.8 cm Pathology: Small focus of atypical glands left lateral apex   He returns today for follow-up.  No new urinary symptoms.  No dysuria or gross hematuria. IPSS = 3 today. He has not resumed his testosterone replacement therapy.   Previous medical records including urology visits reviewed during the patient's office visit  Allergies: No Known Allergies  PMH: Past Medical History:  Diagnosis Date   Hypogonadism in male     PSH: Past Surgical History:  Procedure Laterality Date   VASECTOMY      SH: Social History   Tobacco Use   Smoking status: Former    Types: Cigarettes    Quit date: 05/29/1990    Years since quitting: 31.6   Smokeless tobacco: Never  Vaping Use   Vaping Use: Never used  Substance Use Topics   Alcohol use: No   Drug use: No    ROS: All other review of systems were reviewed and are negative except what is noted above in HPI  PE: BP (!) 143/78   Pulse 80  GENERAL APPEARANCE:  Well appearing, well developed, well nourished, NAD HEENT:  Atraumatic, normocephalic NECK:  Supple. Trachea midline ABDOMEN:  Soft,  non-tender, no masses EXTREMITIES:  Moves all extremities well, without clubbing, cyanosis, or edema NEUROLOGIC:  Alert and oriented x 3, normal gait, CN II-XII grossly intact MENTAL STATUS:  appropriate BACK:  Non-tender to palpation, right-sided CVA tenderness SKIN:  Warm, dry, and intact   Results: Laboratory Data: Lab Results  Component Value Date   WBC 10.4 02/14/2021   HGB 16.5 02/14/2021   HCT 49.2 02/14/2021   MCV 97.6 02/14/2021   PLT 273 02/14/2021    Lab Results  Component Value Date   CREATININE 1.24 02/14/2021    Lab Results  Component Value Date   PSA 3.46 02/14/2021   PSA 1.78 08/10/2020   PSA  1.42 02/10/2020    Lab Results  Component Value Date   TESTOSTERONE 398 03/01/2021    No results found for: "HGBA1C"  Urinalysis    Component Value Date/Time   APPEARANCEUR Clear 06/20/2021 1209   GLUCOSEU Negative 06/20/2021 1209   BILIRUBINUR Negative 06/20/2021 1209   PROTEINUR Negative 06/20/2021 1209   NITRITE Negative 06/20/2021 1209   LEUKOCYTESUR Negative 06/20/2021 1209    Lab Results  Component Value Date   LABMICR See below: 06/20/2021   WBCUA None seen 06/20/2021   LABEPIT None seen 06/20/2021   BACTERIA None seen 06/20/2021    Pertinent Imaging: No results found for this or any previous visit.  No results found for this or any previous visit.  No results found for this or any previous visit.  No results found for this or any previous visit.  No results found for this or any previous visit.  No results found for this or any previous visit.  No results found for this or any previous visit.  No results found for this or any previous visit.  No results found for this or any previous visit (from the past 24 hour(s)).

## 2022-01-10 NOTE — Addendum Note (Signed)
Addended by: Simonne Come A on: 01/10/2022 03:04 PM   Modules accepted: Orders

## 2022-01-10 NOTE — Telephone Encounter (Signed)
Requested Prescriptions  Pending Prescriptions Disp Refills  . sildenafil (VIAGRA) 100 MG tablet [Pharmacy Med Name: SILDENAFIL 100 MG TABLET] 30 tablet 1    Sig: TAKE 0.5-1 TABLET BY MOUTH DAILY AS NEEDED FOR ERECTILE DYSFUNCTION     Urology: Erectile Dysfunction Agents Failed - 01/10/2022 11:51 AM      Failed - Last BP in normal range    BP Readings from Last 1 Encounters:  06/20/21 (!) 148/88         Passed - AST in normal range and within 360 days    AST  Date Value Ref Range Status  02/14/2021 15 10 - 35 U/L Final         Passed - ALT in normal range and within 360 days    ALT  Date Value Ref Range Status  02/14/2021 17 9 - 46 U/L Final         Passed - Valid encounter within last 12 months    Recent Outpatient Visits          10 months ago Elevated PSA   [place] Susy Frizzle, MD   11 months ago Hypogonadism in male   [place], Cammie Mcgee, MD   1 year ago Encounter for lipid screening for cardiovascular disease   [place] Susy Frizzle, MD   1 year ago Hypogonadism male   [place], Cammie Mcgee, MD   2 years ago Hypogonadism male   [place], Cammie Mcgee, MD      Future Appointments            Today [place], Berneice Heinrich, [place]

## 2022-01-12 LAB — MICROSCOPIC EXAMINATION: Bacteria, UA: NONE SEEN

## 2022-01-12 LAB — URINALYSIS, ROUTINE W REFLEX MICROSCOPIC
Bilirubin, UA: NEGATIVE
Glucose, UA: NEGATIVE
Leukocytes,UA: NEGATIVE
Nitrite, UA: NEGATIVE
Specific Gravity, UA: 1.025 (ref 1.005–1.030)
Urobilinogen, Ur: 1 mg/dL (ref 0.2–1.0)
pH, UA: 5.5 (ref 5.0–7.5)

## 2022-01-24 ENCOUNTER — Telehealth: Payer: Self-pay

## 2022-01-24 NOTE — Telephone Encounter (Signed)
-----   Message from Reynaldo Minium, [place] sent at 01/24/2022  8:39 AM EDT ----- Please contact pt to see if he passed his stone. He needs OV after renal US approx 6 weeks after he passed the stone to ensure the hydronephrosis caused by the stone obstruction has resolved. If he has not passed his stone, he needs KUB and OV this week. ----- Message ----- From: Interface, Rad Results In Sent: 01/10/2022   5:06 PM EDT To: Reynaldo Minium, PA-C

## 2022-01-24 NOTE — Telephone Encounter (Signed)
Made patient aware that  renal US need to be done after patient has passed stone to ensure that there is no stone obstruction. Patient voiced that he has passed the stone and he do not think he need anything further because he feel fine.

## 2022-12-22 IMAGING — DX DG WRIST COMPLETE 3+V*L*
3 series · 3 of 3 positions shown · non-contrast
Comparison: None.

CLINICAL DATA: Left wrist swelling after fall last night.

EXAM:
LEFT WRIST - COMPLETE 3+ VIEW

[wrist pa]
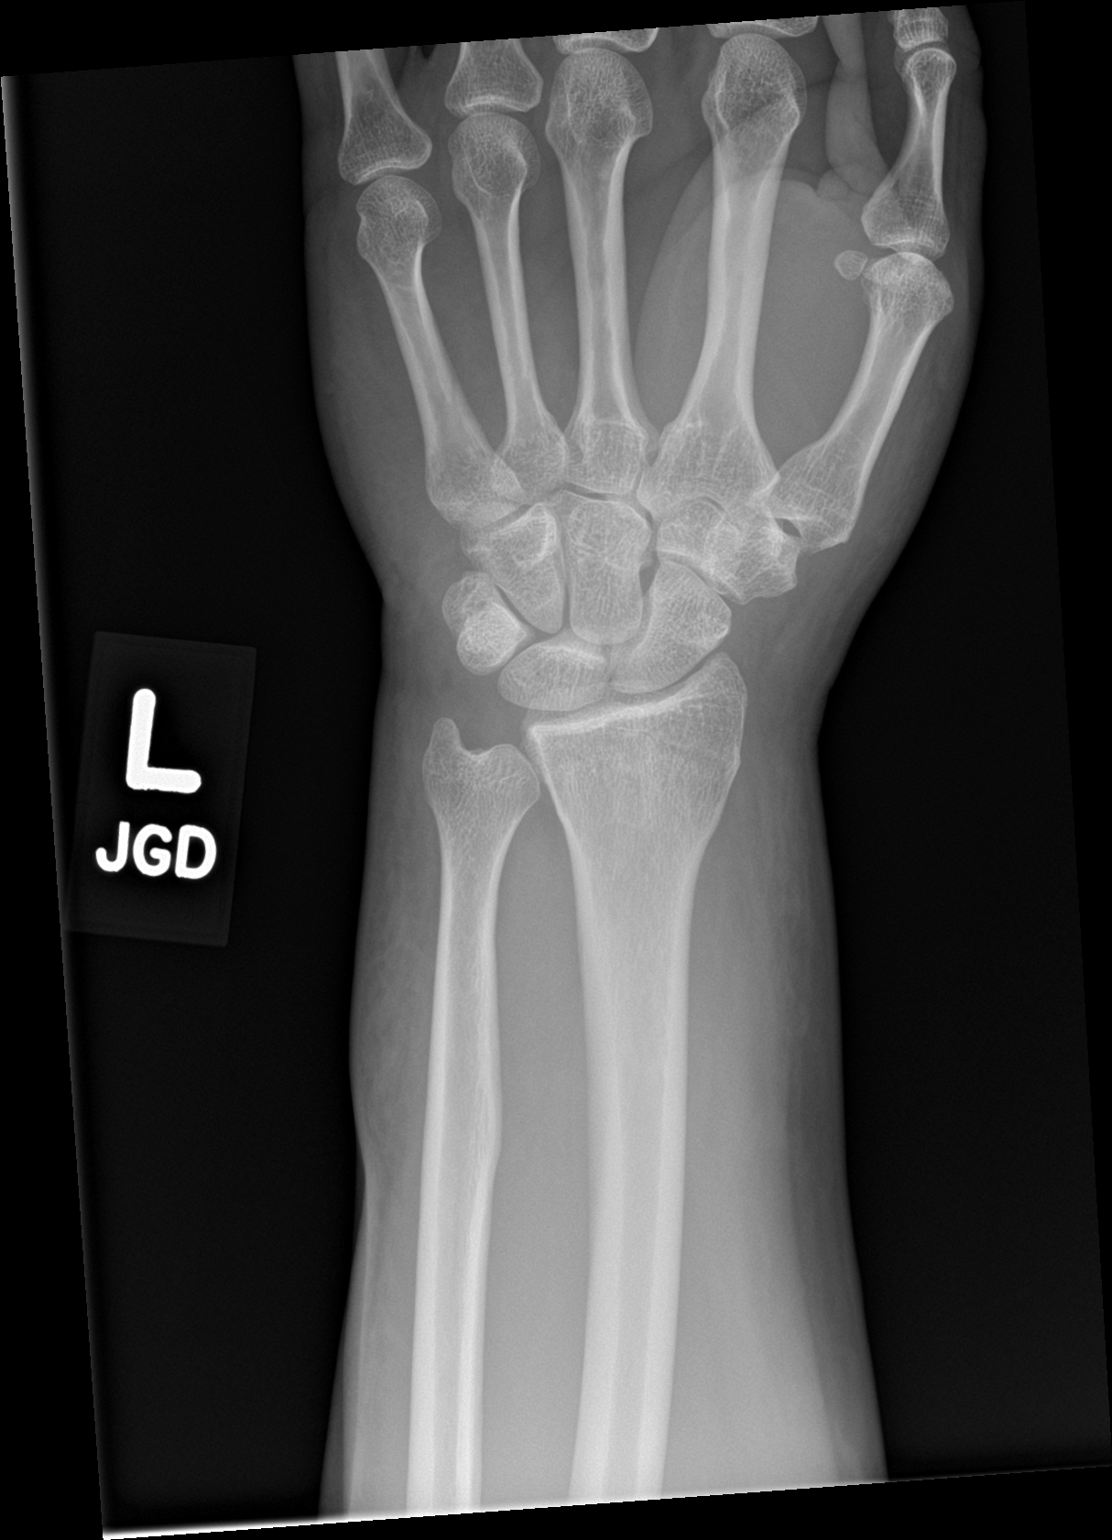

[wrist obl]
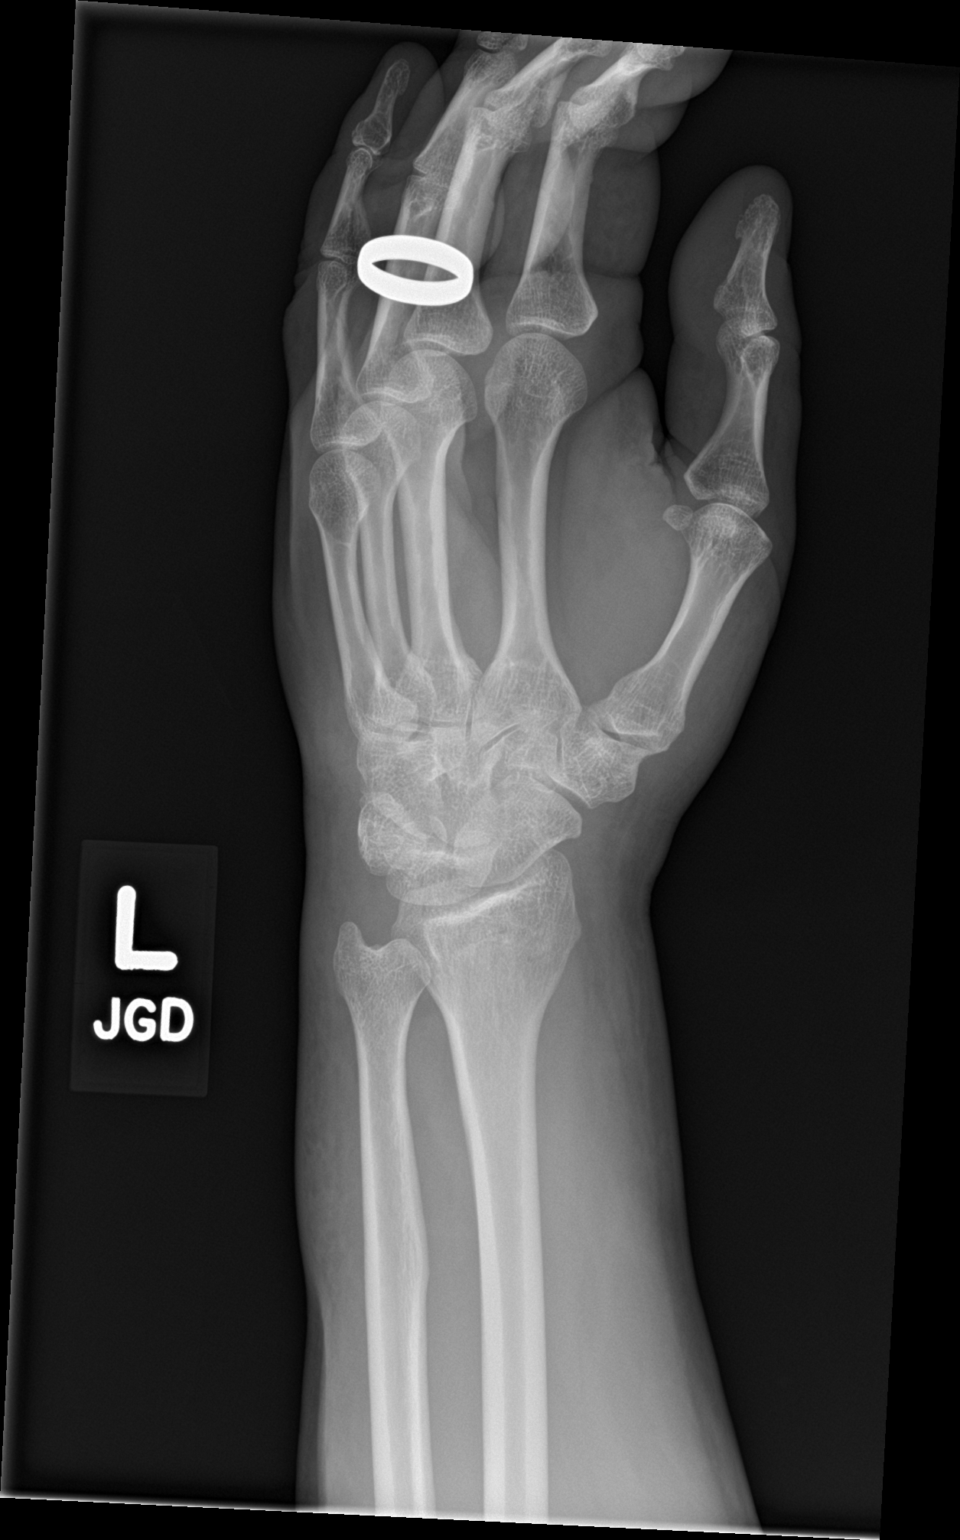

[wrist lat]
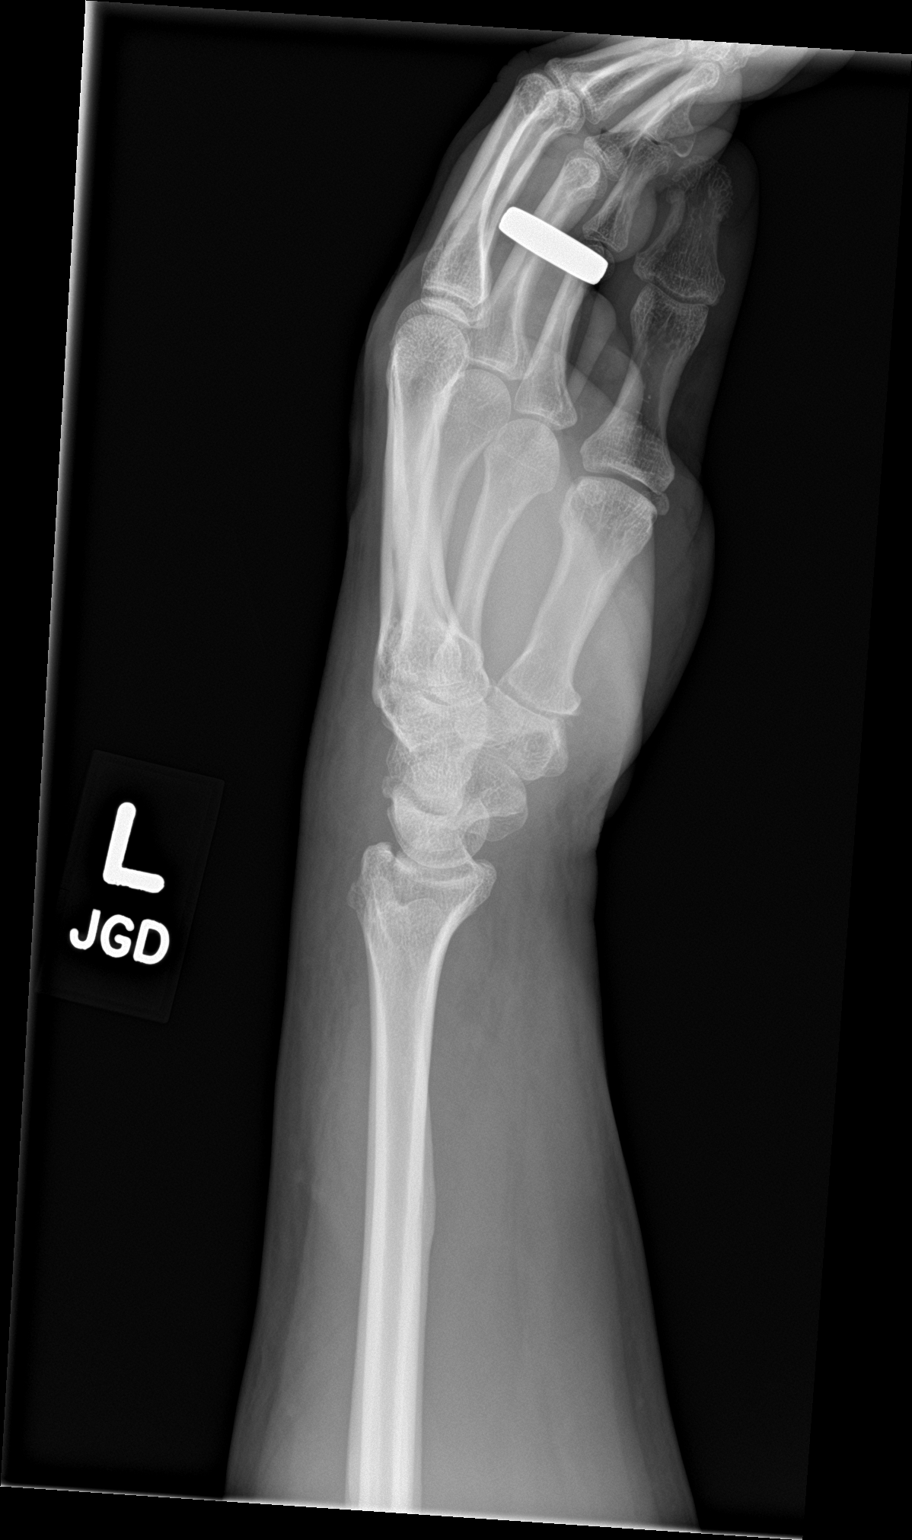

[3 of 3 positions shown; findings below may reference images not displayed]

FINDINGS: There appears to be a mildly displaced fracture involving the distal
left radius with intra-articular extension. The ulna is
unremarkable.
IMPRESSION: Probable mildly displaced fracture of distal left radius with
intra-articular extension. CT scan may be performed for further
evaluation.

## 2023-06-12 ENCOUNTER — Ambulatory Visit (INDEPENDENT_AMBULATORY_CARE_PROVIDER_SITE_OTHER): Payer: Commercial Managed Care - PPO | Admitting: Family Medicine

## 2023-06-12 ENCOUNTER — Encounter: Payer: Self-pay | Admitting: Family Medicine

## 2023-06-12 ENCOUNTER — Telehealth: Payer: Self-pay | Admitting: Family Medicine

## 2023-06-12 VITALS — BP 146/86 | HR 57 | Temp 98.4°F | Ht 70.0 in | Wt 189.0 lb

## 2023-06-12 DIAGNOSIS — Z23 Encounter for immunization: Secondary | ICD-10-CM | POA: Diagnosis not present

## 2023-06-12 DIAGNOSIS — Z13220 Encounter for screening for lipoid disorders: Secondary | ICD-10-CM

## 2023-06-12 DIAGNOSIS — Z1211 Encounter for screening for malignant neoplasm of colon: Secondary | ICD-10-CM

## 2023-06-12 DIAGNOSIS — Z0000 Encounter for general adult medical examination without abnormal findings: Secondary | ICD-10-CM | POA: Diagnosis not present

## 2023-06-12 DIAGNOSIS — Z125 Encounter for screening for malignant neoplasm of prostate: Secondary | ICD-10-CM | POA: Diagnosis not present

## 2023-06-12 MED ORDER — METRONIDAZOLE 0.75 % EX CREA: TOPICAL_CREAM | Freq: Two times a day (BID) | CUTANEOUS | 11 refills | Status: AC

## 2023-06-12 MED ORDER — SILDENAFIL CITRATE 100 MG PO TABS: 100.0000 mg | ORAL_TABLET | ORAL | 11 refills | Status: DC | PRN

## 2023-06-12 NOTE — Progress Notes (Signed)
 Subjective:    Patient ID: Frederick Hayes, male    DOB: November 01, 1965, 58 y.o.   MRN: [id number]  HPI   Patient is a 58-year-old patient, here for complete physical dam.  He is due for prostate cancer screening.  He is also due for colon cancer screening.  He declines a colonoscopy but he does consent to Cologuard.  He is due for a flu shot as well as the shingles vaccine.  He declines all vaccinations today.  He is interested in a coronary artery calcium  score.  He is concerned about possible blockages in his heart.  He states that he is not having any angina or chest pain or shortness of breath however he would like to proceed with this elective test to determine his underlying coronary artery disease risk. Past Medical History:  Diagnosis Date   Hypogonadism in male    Past Surgical History:  Procedure Laterality Date   VASECTOMY     Current Outpatient Medications on File Prior to Visit  Medication Sig Dispense Refill   ondansetron (ZOFRAN-ODT) 4 MG disintegrating tablet Take 4 mg by mouth every 8 (eight) hours as needed.     oxyCODONE -acetaminophen  (PERCOCET) 5-325 MG tablet Take 1 tablet by mouth every 6 (six) hours as needed for severe pain. 20 tablet 0   tamsulosin (FLOMAX) 0.4 MG CAPS capsule Take 0.4 mg by mouth daily.     No current facility-administered medications on file prior to visit.   No Known Allergies Social History   Socioeconomic History   Marital status: Married    Spouse name: Not on file   Number of children: Not on file   Years of education: Not on file   Highest education level: Not on file  Occupational History   Not on file  Tobacco Use   Smoking status: Former    Current packs/day: 0.00    Types: Cigarettes    Quit date: 05/29/1990    Years since quitting: 33.0   Smokeless tobacco: Never  Vaping Use   Vaping status: Never Used  Substance and Sexual Activity   Alcohol use: No   Drug use: No   Sexual activity: Yes    Birth control/protection:  Surgical    Comment: Married  Other Topics Concern   Not on file  Social History Narrative   Not on file   Social Drivers of Health   Financial Resource Strain: Not on file  Food Insecurity: Not on file  Transportation Needs: Not on file  Physical Activity: Not on file  Stress: Not on file  Social Connections: Not on file  Intimate Partner Violence: Not on file      Review of Systems  All other systems reviewed and are negative.      Objective:   Physical Exam Vitals reviewed.  Cardiovascular:     Rate and Rhythm: Normal rate and regular rhythm.     Heart sounds: Normal heart sounds. No murmur heard.    No friction rub. No gallop.  Pulmonary:     Effort: Pulmonary effort is normal. No respiratory distress.     Breath sounds: Normal breath sounds. No wheezing or rales.  Abdominal:     General: Bowel sounds are normal. There is no distension.     Palpations: Abdomen is soft.     Tenderness: There is no abdominal tenderness. There is no rebound.           Assessment & Plan:   Colon cancer screening - Plan:  Cologuard  Screening cholesterol level - Plan: CBC with Differential/Platelet, COMPLETE METABOLIC PANEL WITH GFR, Lipid panel  Prostate cancer screening - Plan: PSA  General medical exam - Plan: CT CARDIAC SCORING (SELF PAY ONLY) Recommended the shingles shot today along with a flu shot.  Patient politely declined.  Schedule the patient for Cologuard to screen for colon cancer.  Screen for prostate cancer with a PSA.  Check CBC, CMP, and fasting lipid panel.  Goal LDL cholesterol is less than 100.  Patient request a coronary artery CT to calculate his coronary artery calcium  score.  I am happy to do this.  If significantly elevated greater than 70% I would recommend statin therapy.

## 2023-06-12 NOTE — Telephone Encounter (Signed)
 Marland Kitchen

## 2023-06-12 NOTE — Addendum Note (Signed)
 Addended by: Venia Carbon K on: 06/12/2023 12:34 PM   Modules accepted: Orders

## 2023-06-13 ENCOUNTER — Telehealth: Payer: Self-pay

## 2023-06-13 LAB — LIPID PANEL
Cholesterol: 146 mg/dL (ref <200)
HDL: 40 mg/dL (ref >=40)
LDL Cholesterol (Calc): 89 mg/dL
Non-HDL Cholesterol (Calc): 106 mg/dL (ref <130)
Total CHOL/HDL Ratio: 3.7 (calc) (ref <5.0)
Triglycerides: 79 mg/dL (ref <150)

## 2023-06-13 LAB — CBC WITH DIFFERENTIAL/PLATELET
Absolute Lymphocytes: 1645 {cells}/uL (ref 850–3900)
Absolute Monocytes: 714 {cells}/uL (ref 200–950)
Basophils Absolute: 75 {cells}/uL (ref 0–200)
Basophils Relative: 0.8 %
Eosinophils Absolute: 75 {cells}/uL (ref 15–500)
Eosinophils Relative: 0.8 %
HCT: 49 % (ref 38.5–50.0)
Hemoglobin: 16.7 g/dL (ref 13.2–17.1)
MCH: 32.7 pg (ref 27.0–33.0)
MCHC: 34.1 g/dL (ref 32.0–36.0)
MCV: 95.9 fL (ref 80.0–100.0)
MPV: 11.1 fL (ref 7.5–12.5)
Monocytes Relative: 7.6 %
Neutro Abs: 6890 {cells}/uL (ref 1500–7800)
Neutrophils Relative %: 73.3 %
Platelets: 317 10*3/uL (ref 140–400)
RBC: 5.11 10*6/uL (ref 4.20–5.80)
RDW: 12.3 % (ref 11.0–15.0)
Total Lymphocyte: 17.5 %
WBC: 9.4 10*3/uL (ref 3.8–10.8)

## 2023-06-13 LAB — COMPLETE METABOLIC PANEL WITH GFR
AG Ratio: 2.1 (calc) (ref 1.0–2.5)
ALT: 13 U/L (ref 9–46)
AST: 20 U/L (ref 10–35)
Albumin: 4.8 g/dL (ref 3.6–5.1)
Alkaline phosphatase (APISO): 55 U/L (ref 35–144)
BUN/Creatinine Ratio: 23 (calc) — ABNORMAL HIGH (ref 6–22)
BUN: 26 mg/dL — ABNORMAL HIGH (ref 7–25)
CO2: 27 mmol/L (ref 20–32)
Calcium: 9.7 mg/dL (ref 8.6–10.3)
Chloride: 105 mmol/L (ref 98–110)
Creat: 1.15 mg/dL (ref 0.70–1.30)
Globulin: 2.3 g/dL (ref 1.9–3.7)
Glucose, Bld: 86 mg/dL (ref 65–99)
Potassium: 4.6 mmol/L (ref 3.5–5.3)
Sodium: 141 mmol/L (ref 135–146)
Total Bilirubin: 0.5 mg/dL (ref 0.2–1.2)
Total Protein: 7.1 g/dL (ref 6.1–8.1)
eGFR: 74 mL/min/{1.73_m2} (ref >=60)

## 2023-06-13 LAB — PSA: PSA: 1.61 ng/mL (ref <=4.00)

## 2023-06-13 NOTE — Telephone Encounter (Signed)
 Fax received from pharmacy and insurance will not cover Sildenafil . They will cover Tadalafil , Vardenafil and Avanafil.

## 2023-06-14 ENCOUNTER — Other Ambulatory Visit: Payer: Self-pay | Admitting: Family Medicine

## 2023-06-14 MED ORDER — TADALAFIL 20 MG PO TABS: 10.0000 mg | ORAL_TABLET | ORAL | 11 refills | Status: AC | PRN

## 2023-06-21 ENCOUNTER — Ambulatory Visit (HOSPITAL_COMMUNITY)
Admission: RE | Admit: 2023-06-21 | Discharge: 2023-06-21 | Disposition: A | Discharge status: Home / Self Care | Payer: Self-pay | Source: Ambulatory Visit | Attending: Family Medicine | Admitting: Family Medicine

## 2023-06-21 DIAGNOSIS — Z0000 Encounter for general adult medical examination without abnormal findings: Secondary | ICD-10-CM | POA: Insufficient documentation

## 2023-06-22 ENCOUNTER — Other Ambulatory Visit: Payer: Self-pay

## 2023-06-22 ENCOUNTER — Encounter: Payer: Self-pay | Admitting: Family Medicine

## 2023-06-22 DIAGNOSIS — R931 Abnormal findings on diagnostic imaging of heart and coronary circulation: Secondary | ICD-10-CM

## 2023-06-22 MED ORDER — ROSUVASTATIN CALCIUM 20 MG PO TABS: 20.0000 mg | ORAL_TABLET | Freq: Every day | ORAL | 3 refills | Status: AC

## 2023-06-22 MED ORDER — ASPIRIN 81 MG PO TBEC: 81.0000 mg | DELAYED_RELEASE_TABLET | Freq: Every day | ORAL | 1 refills | Status: AC

## 2023-06-25 LAB — COLOGUARD: COLOGUARD: NEGATIVE

## 2023-06-26 ENCOUNTER — Telehealth: Payer: Self-pay

## 2023-06-26 NOTE — Telephone Encounter (Signed)
Copied from CRM [id number]. Topic: Clinical - Medical Advice >> Jun 26, 2023  3:57 PM Maxwell Marion wrote: Reason for CRM: Patient is wanting a call from Dr. Tanya Nones to thoroughly discuss cardiac CT results. He is wanting to know if a referral to a heart specialist is an option for him or what should he do

## 2023-06-27 NOTE — Telephone Encounter (Signed)
Attempted to call pt, no answer. LVM for pt to call back re: CT and pcp's msg

## 2023-08-10 ENCOUNTER — Ambulatory Visit: Payer: No Typology Code available for payment source

## 2023-08-10 VITALS — BP 130/76

## 2023-08-10 DIAGNOSIS — Z23 Encounter for immunization: Secondary | ICD-10-CM

## 2023-08-10 NOTE — Progress Notes (Signed)
 Patient is in office today for a nurse visit for Immunization. Patient Injection was given in the  Right deltoid. Patient tolerated injection well.

## 2024-03-24 ENCOUNTER — Ambulatory Visit: Admitting: Family Medicine

## 2024-03-24 ENCOUNTER — Ambulatory Visit: Payer: Self-pay | Admitting: Family Medicine

## 2024-03-24 ENCOUNTER — Encounter: Payer: Self-pay | Admitting: Family Medicine

## 2024-03-24 VITALS — BP 137/79 | HR 56 | Temp 98.9°F | Ht 70.0 in | Wt 198.4 lb

## 2024-03-24 DIAGNOSIS — K219 Gastro-esophageal reflux disease without esophagitis: Secondary | ICD-10-CM | POA: Insufficient documentation

## 2024-03-24 DIAGNOSIS — J069 Acute upper respiratory infection, unspecified: Secondary | ICD-10-CM | POA: Diagnosis not present

## 2024-03-24 DIAGNOSIS — R351 Nocturia: Secondary | ICD-10-CM | POA: Diagnosis not present

## 2024-03-24 LAB — URINALYSIS, ROUTINE W REFLEX MICROSCOPIC
Bacteria, UA: NONE SEEN /HPF
Bilirubin Urine: NEGATIVE
Glucose, UA: NEGATIVE
Hyaline Cast: NONE SEEN /LPF
Ketones, ur: NEGATIVE
Leukocytes,Ua: NEGATIVE
Nitrite: NEGATIVE
Specific Gravity, Urine: 1.02 (ref 1.001–1.035)
Squamous Epithelial / HPF: NONE SEEN /HPF (ref <=5)
pH: 7 (ref 5.0–8.0)

## 2024-03-24 LAB — MICROSCOPIC MESSAGE

## 2024-03-24 MED ORDER — OMEPRAZOLE 20 MG PO CPDR: 20.0000 mg | DELAYED_RELEASE_CAPSULE | Freq: Every day | ORAL | 3 refills | Status: AC

## 2024-03-24 NOTE — Assessment & Plan Note (Addendum)
 The pathophysiology of reflux is discussed.  Anti-reflux measures such as raising the head of the bed, avoiding tight clothing or belts, avoiding eating late at night and not lying down shortly after mealtime and achieving weight loss are discussed. Avoid ASA, NSAID's, caffeine, peppermints, alcohol and tobacco. OTC H2 blockers and/or antacids are often very helpful for PRN use. However, for persisting chronic or daily symptoms, prescription strength H2 blockers or a trial of PPI's are often used. Seek medical care if there are persistent symptoms, dysphagia, weight loss or GI bleeding. Follow up if symptoms worse or do not improve in 1 week. No evidence of hiatal hernia on CT 05/2023. Discussed red flag symptoms and when to seek emergency medical care.

## 2024-03-24 NOTE — Assessment & Plan Note (Signed)
 Declined viral testing. Reassured patient that symptoms and exam findings are most consistent with a viral upper respiratory infection and explained lack of efficacy of antibiotics against viruses.  Discussed expected course and features suggestive of secondary bacterial infection.  Continue supportive care. Increase fluid intake with water or electrolyte solution like pedialyte. Encouraged acetaminophen as needed for fever/pain. Encouraged salt water gargling, chloraseptic spray and throat lozenges. Encouraged OTC guaifenesin. Encouraged saline sinus flushes and/or neti with humidified air.

## 2024-03-24 NOTE — Progress Notes (Signed)
 Subjective:  HPI: Frederick Hayes is a 58 y.o. male presenting on 03/24/2024 for Acute Visit (Abdominal pain and bloating /Pt states he Feels like he is experiencing a hiatal hernia; states he is experiencing gas, bloating, frequent belching coughing till he is gasping for air,  drainage of sinus , frequent urination. )   HPI Discussed the use of AI scribe software for clinical note transcription with the patient, who gave verbal consent to proceed.  History of Present Illness Frederick Hayes is a 58 year old male who presents with symptoms suggestive of a hiatal hernia and recent respiratory distress.  He has experienced symptoms over the years that he attributes to a hiatal hernia, including discomfort, belching, gas, and occasional heartburn. These symptoms have become more noticeable over the past few days. He has not been formally diagnosed with a hiatal hernia, and prior imaging (coronary artery calcium  score CT) did not show evidence of a hiatal hernia. He has not taken any medications like Prilosec for these symptoms in the past. He has a history of occasional mild acid reflux and regurgitation, which typically resolves on its own. No vomiting, difficulty swallowing, or changes in bowel habits. He is a regular coffee drinker but does not consume alcohol, nicotine, or spicy foods.  Two nights ago, he began experiencing frequent urination at night, recording seven episodes the first night and four the following night. He is unsure if this is related to his other symptoms. No burning during urination.  Yesterday, he began feeling symptoms of a head cold, including a sore throat that resolved, and clear phlegm production. This morning, after a shower, he experienced a coughing episode and severe episode of respiratory distress, where he was unable to breathe and was gasping for air. This episode was frightening and lasted long enough for him to fear passing out. He associates this with his  suspected hiatal hernia but acknowledges it could be related to his cold symptoms. No fever, chills, or body aches.      Review of Systems  All other systems reviewed and are negative.   Relevant past medical history reviewed and updated as indicated.   Past Medical History:  Diagnosis Date   High coronary artery calcium  score    Hypogonadism in male      Past Surgical History:  Procedure Laterality Date   VASECTOMY      Allergies and medications reviewed and updated.   Current Outpatient Medications:    omeprazole (PRILOSEC) 20 MG capsule, Take 1 capsule (20 mg total) by mouth daily., Disp: 30 capsule, Rfl: 3   tadalafil  (CIALIS ) 20 MG tablet, Take 0.5-1 tablets (10-20 mg total) by mouth every other day as needed for erectile dysfunction., Disp: 10 tablet, Rfl: 11   aspirin  EC 81 MG tablet, Take 1 tablet (81 mg total) by mouth daily. Swallow whole. (Patient not taking: Reported on 03/24/2024), Disp: 90 tablet, Rfl: 1   metroNIDAZOLE  (METROCREAM ) 0.75 % cream, Apply topically 2 (two) times daily. (Patient not taking: Reported on 03/24/2024), Disp: 45 g, Rfl: 11   ondansetron (ZOFRAN-ODT) 4 MG disintegrating tablet, Take 4 mg by mouth every 8 (eight) hours as needed. (Patient not taking: Reported on 03/24/2024), Disp: , Rfl:    oxyCODONE -acetaminophen  (PERCOCET) 5-325 MG tablet, Take 1 tablet by mouth every 6 (six) hours as needed for severe pain. (Patient not taking: Reported on 03/24/2024), Disp: 20 tablet, Rfl: 0   rosuvastatin  (CRESTOR ) 20 MG tablet, Take 1 tablet (20 mg total) by  mouth daily. (Patient not taking: Reported on 03/24/2024), Disp: 90 tablet, Rfl: 3   sildenafil  (VIAGRA ) 100 MG tablet, Take 100 mg by mouth as needed. (Patient not taking: Reported on 03/24/2024), Disp: , Rfl:    tamsulosin (FLOMAX) 0.4 MG CAPS capsule, Take 0.4 mg by mouth daily. (Patient not taking: Reported on 03/24/2024), Disp: , Rfl:   No Known Allergies  Objective:   BP 137/79   Pulse (!)  56   Temp 98.9 F (37.2 C)   Ht 5' 10 (1.778 m)   Wt 198 lb 6.4 oz (90 kg)   SpO2 96%   BMI 28.47 kg/m      03/24/2024    8:48 AM 08/10/2023    9:00 AM 06/12/2023    9:27 AM  Vitals with BMI  Height 5' 10  5' 10  Weight 198 lbs 6 oz  189 lbs  BMI 28.47  27.12  Systolic 137 130 853  Diastolic 79 76 86  Pulse 56  57     Physical Exam Vitals and nursing note reviewed.  Constitutional:      Appearance: Normal appearance. He is normal weight.  HENT:     Head: Normocephalic and atraumatic.     Right Ear: Tympanic membrane, ear canal and external ear normal.     Left Ear: Tympanic membrane, ear canal and external ear normal.     Nose: Nose normal.     Mouth/Throat:     Mouth: Mucous membranes are moist.     Pharynx: Oropharynx is clear.  Eyes:     Extraocular Movements: Extraocular movements intact.     Conjunctiva/sclera: Conjunctivae normal.  Cardiovascular:     Rate and Rhythm: Normal rate and regular rhythm.     Pulses: Normal pulses.     Heart sounds: Normal heart sounds.  Pulmonary:     Effort: Pulmonary effort is normal.     Breath sounds: Normal breath sounds.  Abdominal:     General: Bowel sounds are normal. There is no distension.     Palpations: Abdomen is soft. There is no mass.     Tenderness: There is no abdominal tenderness.     Hernia: No hernia is present.  Musculoskeletal:     Cervical back: No tenderness.  Lymphadenopathy:     Cervical: No cervical adenopathy.  Skin:    General: Skin is warm and dry.     Capillary Refill: Capillary refill takes less than 2 seconds.  Neurological:     General: No focal deficit present.     Mental Status: He is alert and oriented to person, place, and time. Mental status is at baseline.  Psychiatric:        Mood and Affect: Mood normal.        Behavior: Behavior normal.        Thought Content: Thought content normal.        Judgment: Judgment normal.     Assessment & Plan:  Gastroesophageal reflux  disease without esophagitis Assessment & Plan: The pathophysiology of reflux is discussed.  Anti-reflux measures such as raising the head of the bed, avoiding tight clothing or belts, avoiding eating late at night and not lying down shortly after mealtime and achieving weight loss are discussed. Avoid ASA, NSAID's, caffeine, peppermints, alcohol and tobacco. OTC H2 blockers and/or antacids are often very helpful for PRN use. However, for persisting chronic or daily symptoms, prescription strength H2 blockers or a trial of PPI's are often used. Seek medical care if  there are persistent symptoms, dysphagia, weight loss or GI bleeding. Follow up if symptoms worse or do not improve in 1 week. No evidence of hiatal hernia on CT 05/2023. Discussed red flag symptoms and when to seek emergency medical care.     Viral URI with cough Assessment & Plan: Declined viral testing. Reassured patient that symptoms and exam findings are most consistent with a viral upper respiratory infection and explained lack of efficacy of antibiotics against viruses.  Discussed expected course and features suggestive of secondary bacterial infection.  Continue supportive care. Increase fluid intake with water or electrolyte solution like pedialyte. Encouraged acetaminophen  as needed for fever/pain. Encouraged salt water gargling, chloraseptic spray and throat lozenges. Encouraged OTC guaifenesin. Encouraged saline sinus flushes and/or neti with humidified air.     Nocturia Assessment & Plan: Acute, improving. UA negative.  Orders: -     Urinalysis, Routine w reflex microscopic  Other orders -     Omeprazole; Take 1 capsule (20 mg total) by mouth daily.  Dispense: 30 capsule; Refill: 3     Follow up plan: Return if symptoms worsen or fail to improve.  Jeoffrey GORMAN Barrio, FNP

## 2024-03-24 NOTE — Assessment & Plan Note (Signed)
 Acute, improving. UA negative.

## 2024-03-24 NOTE — Telephone Encounter (Signed)
 FYI Only or Action Required?: Action required by provider: request for appointment.  Patient was last seen in primary care on 06/12/2023 by Duanne Butler DASEN, MD.  Called Nurse Triage reporting Abdominal Pain.  Symptoms began several weeks ago.  Interventions attempted: Nothing.  Symptoms are: gradually worsening. Gets chocked up, has bloating.  Triage Disposition: See Physician Within 24 Hours  Patient/caregiver understands and will follow disposition?: Yes      Copied from CRM [id number]. Topic: Clinical - Red Word Triage >> Mar 24, 2024  8:03 AM Berwyn MATSU wrote: Red Word that prompted transfer to Nurse Triage: Patients wife Danette Stell bad episode of stomach issue drainage shortness of breathe and belching and cough; believes patient has hiatal hernia. Reason for Disposition  [1] MODERATE pain (e.g., interferes with normal activities) AND [2] pain comes and goes (cramps) AND [3] present > 24 hours  (Exception: Pain with Vomiting or Diarrhea - see that Guideline.)  Answer Assessment - Initial Assessment Questions 1. LOCATION: Where does it hurt?      All over 2. RADIATION: Does the pain shoot anywhere else? (e.g., chest, back)     no 3. ONSET: When did the pain begin? (Minutes, hours or days ago)      Awhile, but getting worse 4. SUDDEN: Gradual or sudden onset?     gradual 5. PATTERN Does the pain come and go, or is it constant?     Comes and goes 6. SEVERITY: How bad is the pain?  (e.g., Scale 1-10; mild, moderate, or severe)     severe 7. RECURRENT SYMPTOM: Have you ever had this type of stomach pain before? If Yes, ask: When was the last time? and What happened that time?      yes 8. CAUSE: What do you think is causing the stomach pain? (e.g., gallstones, recent abdominal surgery)     hernia 9. RELIEVING/AGGRAVATING FACTORS: What makes it better or worse? (e.g., antacids, bending or twisting motion, bowel movement)     no 10. OTHER SYMPTOMS:  Do you have any other symptoms? (e.g., back pain, diarrhea, fever, urination pain, vomiting)      Bloating  Protocols used: Abdominal Pain - Male-A-AH

## 2024-06-28 ENCOUNTER — Other Ambulatory Visit: Payer: Self-pay | Admitting: Family Medicine
